# Patient Record
Sex: Male | Born: 1965 | Race: White | Marital: Married | Smoking: Former smoker
Health system: Southern US, Community
[De-identification: ages and names within clinical notes are randomized; demographics above are authoritative.]

## PROBLEM LIST (undated history)

## (undated) DIAGNOSIS — J45909 Unspecified asthma, uncomplicated: Secondary | ICD-10-CM

## (undated) DIAGNOSIS — G4734 Idiopathic sleep related nonobstructive alveolar hypoventilation: Secondary | ICD-10-CM

## (undated) DIAGNOSIS — J449 Chronic obstructive pulmonary disease, unspecified: Secondary | ICD-10-CM

## (undated) HISTORY — DX: Unspecified asthma, uncomplicated: J45.909

## (undated) HISTORY — DX: Idiopathic sleep related nonobstructive alveolar hypoventilation: G47.34

## (undated) HISTORY — DX: Chronic obstructive pulmonary disease, unspecified: J44.9

---

## 2002-12-17 ENCOUNTER — Ambulatory Visit (HOSPITAL_BASED_OUTPATIENT_CLINIC_OR_DEPARTMENT_OTHER): Admission: RE | Admit: 2002-12-17 | Discharge: 2002-12-17 | Payer: Self-pay | Admitting: Plastic Surgery

## 2011-04-03 ENCOUNTER — Ambulatory Visit (INDEPENDENT_AMBULATORY_CARE_PROVIDER_SITE_OTHER): Payer: BC Managed Care – PPO

## 2011-04-03 DIAGNOSIS — J45902 Unspecified asthma with status asthmaticus: Secondary | ICD-10-CM

## 2011-04-03 DIAGNOSIS — F17200 Nicotine dependence, unspecified, uncomplicated: Secondary | ICD-10-CM

## 2012-04-03 ENCOUNTER — Other Ambulatory Visit: Payer: Self-pay | Admitting: Internal Medicine

## 2012-04-13 ENCOUNTER — Ambulatory Visit (INDEPENDENT_AMBULATORY_CARE_PROVIDER_SITE_OTHER): Payer: BC Managed Care – PPO | Admitting: Internal Medicine

## 2012-04-13 VITALS — BP 150/73 | HR 89 | Temp 99.0°F | Resp 16 | Ht 66.0 in | Wt 222.6 lb

## 2012-04-13 DIAGNOSIS — Z6835 Body mass index (BMI) 35.0-35.9, adult: Secondary | ICD-10-CM

## 2012-04-13 DIAGNOSIS — J45909 Unspecified asthma, uncomplicated: Secondary | ICD-10-CM

## 2012-04-13 DIAGNOSIS — F17200 Nicotine dependence, unspecified, uncomplicated: Secondary | ICD-10-CM

## 2012-04-13 MED ORDER — ALBUTEROL SULFATE HFA 108 (90 BASE) MCG/ACT IN AERS: 2.0000 | INHALATION_SPRAY | Freq: Four times a day (QID) | RESPIRATORY_TRACT | Status: DC | PRN

## 2012-04-13 MED ORDER — FLUTICASONE-SALMETEROL 100-50 MCG/DOSE IN AEPB: 1.0000 | INHALATION_SPRAY | Freq: Two times a day (BID) | RESPIRATORY_TRACT | Status: DC

## 2012-04-14 DIAGNOSIS — J45909 Unspecified asthma, uncomplicated: Secondary | ICD-10-CM | POA: Insufficient documentation

## 2012-04-14 NOTE — Progress Notes (Addendum)
  Subjective:    Patient ID: Jeff Cruz., male    DOB: [birth year], 47 y.o.   MRN: [id number]  HPI First followup in over one year Needs refill of asthma medications As not been able to lose weight Continues to need Advair daily to control symptoms/albuterol use is occasional No pulmonary infections this year Continues to smoke but is less than 1 pack per day now Achieved smoking cessation through hypnosis several years ago before restarting, and prefers to try hypnosis again.  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 2006 Couples there 2006-issues resolved Asthma vs rad smoking 2006 Review of Systems Noncontributory    Objective:   Physical Exam BP 150/73  Pulse 89  Temp 99 F (37.2 C) (Oral)  Resp 16  Ht 5\' 6"  (1.676 m)  Wt 222 lb 9.6 oz (100.971 kg)  BMI 35.93 kg/m2  SpO2 93% HEENT clear Lungs clear with no wheezing on forced expiration Heart regular without murmur       Assessment & Plan:  Problem #1 asthma/reactive airway disease Problem #2 nicotine addiction Problem #3 obesity/BMI 35 Problem #4 elevated blood pressure without diagnosis of hypertension  His pulse ox is low at 93/he must quit smoking and lose weight!! Referred to hypnosis Advised to lose weight with goal 2-3 pounds month until ideal body weight around 150/we both acknowledged that this is unlikely aspiration, but goal nonetheless   outside blood pressures to assess for hypertension  Followup for complete physical examination in this next year  Meds ordered this encounter  Medications  . Fluticasone-Salmeterol (ADVAIR) 100-50 MCG/DOSE AEPB    Sig: Inhale 1 puff into the lungs every 12 (twelve) hours.    Dispense:  1 each    Refill:  11  . albuterol (PROVENTIL HFA;VENTOLIN HFA) 108 (90 BASE) MCG/ACT inhaler    Sig: Inhale 2 puffs into the lungs every 6 (six) hours as needed.    Dispense:  1 Inhaler    Refill:   11

## 2013-03-04 ENCOUNTER — Telehealth: Payer: Self-pay

## 2013-03-04 MED ORDER — ALBUTEROL SULFATE HFA 108 (90 BASE) MCG/ACT IN AERS: 2.0000 | INHALATION_SPRAY | Freq: Four times a day (QID) | RESPIRATORY_TRACT | Status: DC | PRN

## 2013-03-04 NOTE — Telephone Encounter (Signed)
RF req from [facility]. RFd x 1

## 2013-03-14 ENCOUNTER — Other Ambulatory Visit: Payer: Self-pay

## 2013-03-14 MED ORDER — FLUTICASONE-SALMETEROL 100-50 MCG/DOSE IN AEPB: 1.0000 | INHALATION_SPRAY | Freq: Two times a day (BID) | RESPIRATORY_TRACT | Status: DC

## 2013-04-10 ENCOUNTER — Other Ambulatory Visit: Payer: Self-pay | Admitting: Internal Medicine

## 2016-09-19 ENCOUNTER — Telehealth: Payer: Self-pay | Admitting: Pulmonary Disease

## 2016-09-19 NOTE — Telephone Encounter (Signed)
lmtcb x1 for Jeff Cruz.  

## 2016-09-19 NOTE — Telephone Encounter (Signed)
I have spoken with Marliss CzarLeigh, who is aware of this matter.  Will await Renee's return call.

## 2016-09-19 NOTE — Telephone Encounter (Signed)
Renee called back and she is aware of appt for pt on wed at 930.

## 2016-09-19 NOTE — Telephone Encounter (Signed)
I have called and lmomtcb x 2 for renee.

## 2016-09-21 ENCOUNTER — Ambulatory Visit (INDEPENDENT_AMBULATORY_CARE_PROVIDER_SITE_OTHER)
Admission: RE | Admit: 2016-09-21 | Discharge: 2016-09-21 | Disposition: A | Discharge status: Home / Self Care | Payer: 59 | Source: Ambulatory Visit | Attending: Pulmonary Disease | Admitting: Pulmonary Disease

## 2016-09-21 ENCOUNTER — Ambulatory Visit (INDEPENDENT_AMBULATORY_CARE_PROVIDER_SITE_OTHER): Payer: 59 | Admitting: Pulmonary Disease

## 2016-09-21 ENCOUNTER — Other Ambulatory Visit (INDEPENDENT_AMBULATORY_CARE_PROVIDER_SITE_OTHER): Payer: 59

## 2016-09-21 ENCOUNTER — Encounter: Payer: Self-pay | Admitting: Pulmonary Disease

## 2016-09-21 VITALS — BP 128/80 | HR 72 | Temp 98.3°F | Ht 66.0 in | Wt 175.5 lb

## 2016-09-21 DIAGNOSIS — J449 Chronic obstructive pulmonary disease, unspecified: Secondary | ICD-10-CM

## 2016-09-21 DIAGNOSIS — F17210 Nicotine dependence, cigarettes, uncomplicated: Secondary | ICD-10-CM

## 2016-09-21 DIAGNOSIS — F419 Anxiety disorder, unspecified: Secondary | ICD-10-CM | POA: Diagnosis not present

## 2016-09-21 DIAGNOSIS — B370 Candidal stomatitis: Secondary | ICD-10-CM

## 2016-09-21 LAB — CBC WITH DIFFERENTIAL/PLATELET
BASOS ABS: 0 10*3/uL (ref 0.0–0.1)
Basophils Relative: 0.5 % (ref 0.0–3.0)
EOS ABS: 0.2 10*3/uL (ref 0.0–0.7)
Eosinophils Relative: 2 % (ref 0.0–5.0)
HEMATOCRIT: 46.2 % (ref 39.0–52.0)
Hemoglobin: 15.5 g/dL (ref 13.0–17.0)
LYMPHS PCT: 23.5 % (ref 12.0–46.0)
Lymphs Abs: 2 10*3/uL (ref 0.7–4.0)
MCHC: 33.5 g/dL (ref 30.0–36.0)
MCV: 95.1 fl (ref 78.0–100.0)
MONOS PCT: 7.1 % (ref 3.0–12.0)
Monocytes Absolute: 0.6 10*3/uL (ref 0.1–1.0)
NEUTROS ABS: 5.7 10*3/uL (ref 1.4–7.7)
Neutrophils Relative %: 66.9 % (ref 43.0–77.0)
PLATELETS: 236 10*3/uL (ref 150.0–400.0)
RBC: 4.86 Mil/uL (ref 4.22–5.81)
RDW: 14.2 % (ref 11.5–15.5)
WBC: 8.5 10*3/uL (ref 4.0–10.5)

## 2016-09-21 LAB — COMPREHENSIVE METABOLIC PANEL
ALK PHOS: 50 U/L (ref 39–117)
ALT: 11 U/L (ref 0–53)
AST: 18 U/L (ref 0–37)
Albumin: 4.5 g/dL (ref 3.5–5.2)
BILIRUBIN TOTAL: 0.7 mg/dL (ref 0.2–1.2)
BUN: 10 mg/dL (ref 6–23)
CALCIUM: 9.4 mg/dL (ref 8.4–10.5)
CO2: 33 meq/L — AB (ref 19–32)
CREATININE: 0.76 mg/dL (ref 0.40–1.50)
Chloride: 99 mEq/L (ref 96–112)
GFR: 114.49 mL/min (ref >60.00)
GLUCOSE: 113 mg/dL — AB (ref 70–99)
Potassium: 4.5 mEq/L (ref 3.5–5.1)
Sodium: 139 mEq/L (ref 135–145)
TOTAL PROTEIN: 7.5 g/dL (ref 6.0–8.3)

## 2016-09-21 LAB — TSH: TSH: 1.42 u[IU]/mL (ref 0.35–4.50)

## 2016-09-21 MED ORDER — FLUCONAZOLE 100 MG PO TABS: 100.0000 mg | ORAL_TABLET | Freq: Every day | ORAL | 0 refills | Status: DC

## 2016-09-21 MED ORDER — UMECLIDINIUM-VILANTEROL 62.5-25 MCG/INH IN AEPB: 1.0000 | INHALATION_SPRAY | Freq: Every day | RESPIRATORY_TRACT | 11 refills | Status: DC

## 2016-09-21 MED ORDER — PREDNISONE 20 MG PO TABS: ORAL_TABLET | ORAL | 0 refills | Status: DC

## 2016-09-21 MED ORDER — ALBUTEROL SULFATE 108 (90 BASE) MCG/ACT IN AEPB: 1.0000 | INHALATION_SPRAY | Freq: Four times a day (QID) | RESPIRATORY_TRACT | 11 refills | Status: DC | PRN

## 2016-09-21 NOTE — Progress Notes (Signed)
Subjective:     Patient ID: Jeff Cruz., male   DOB: [birth year], 51 y.o.   MRN: [id number]  HPI  ~  September 21, 2016:  Initial pulmonary consult by SN>  Ivory Broad [name]) is referred by his family for a pulmonary eval- he is 51 y/o & a current smoker of 1ppd w/ a 35+pack-yr hx;  He knows that he needs to quit & previously quit for awhile after hypnosis by ?DrOxley, Chantix was w/o benefit he says;  His CC is SOB/ DOE which he feels is slowly getting worse & noticed w/ recreation like water skiing, & also w/ yard work Social research officer, government;  He has a mild AM cough, small amt yellow sput, no hemoptysis, no CP, no hx cardiac issues, he's been treated by PCP & UC for bronchitis in past; he doesn't recall prev CXR; PCP is DrJGolding & current meds include Symbicort160-2spBid, AlbutHFA prn...  Smoking Hx>  Started age ~22, up to 1ppd max, for a 35 pack-yr smoking hx at age 60...   Pulmonary Hx>  Notes occas bronchitic infections, no hx pneumonia, no hx asthma, etc;  No hosp for resp problems,   Medical Hx>  Epic record indicates:  Borderline HBP, "asthma", obesity, referred to [facility] for colonoscopy per DrGolding...  Family Hx>  + for DM, heart dis, & chr lung dis in mother...  Occup Hx>    Current Meds>  Symbicort160-2spBid, albutHFA prn... EXAM shows Afeb, VSS, O2sat=95% on RA at rest;  Wt=176# (he's lost 50# over the last few yrs on diet);  HEENT +thrush, ear wax, no adenopathy;  Chest- bilat congestion/ wheezing/ rhonchi;  Heart- RR w/o m/r/g detected;  Abd- soft nontender neg;  Ext- sl VI w/o c/c/e;  Neuro- intact;  Derm- "farmer's tan"...   CXR 09/21/16 (independently reviewed by me in the PACS system) showed norm heart size, hyperexpanded lungs w/ vasc attenuation c/w COPD/ emphysema- NAD...   Spirometry 09/21/16>  FVC=1.87 (43%), FEV1=0.83 (24%), %1sec=44%, mid-flows reduced at 11% predicted;  This is c/w severe airflow obstruction and GOLD Stage 4 COPD...  Ambulatory oximetry 09/21/16 on RA>   O2sat=94% on RA at rest w/ heart rate=70/min;  He ambulated 3 Laps in office (185'ea) w/ lowest O2sat=86% w/ pulse 101/min...  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 Asthma     History reviewed. No pertinent surgical history.   Outpatient Encounter  Prescriptions as of 09/21/2016  Medication Sig  .  albuterol (PROVENTIL HFA;VENTOLIN HFA) 108 (90 BASE) MCG/ACT inhaler Inhale 2 puffs into the lungs every 6 (six) hours as needed. PATIENT NEEDS OFFICE VISIT FOR ADDITIONAL REFILLS  .  budesonide-formoterol (SYMBICORT) 160-4.5 MCG/ACT inhaler Inhale 2 puffs into the lungs 2 (two) times daily.  . [DISCONTINUED] Fluticasone-Salmeterol (ADVAIR) 100-50 MCG/DOSE AEPB Inhale 1 puff into the lungs every 12 (twelve) hours. PATIENT NEEDS OFFICE VISIT FOR ADDITIONAL REFILLS (Patient not taking: Reported on 09/21/2016)    No Known Allergies   Family History  Problem Relation Age of Onset  . Diabetes Mother   . Heart disease Father   . Heart disease Maternal Grandmother   . Heart  disease Maternal Grandfather   . Heart disease Paternal Grandmother   . Heart disease Paternal Grandfather     Social History   Social History  . Marital status: Divorced    Spouse name: N/A  . Number of children: N/A  . Years of education: N/A   Occupational History  . Not on file.   Social History Main Topics  . Smoking status: Current Every Day Smoker    Packs/day: 1.00    Years: 35.00    Types: Cigarettes  . Smokeless tobacco: Never Used  . Alcohol use Not on file  . Drug use: Unknown  . Sexual activity: Not on file   Other Topics Concern  . Not on file   Social History Narrative  . No narrative on file    Immunization History  Administered Date(s) Administered  . Influenza,inj,Quad PF,36+ Mos 12/28/2015    Current Medications, Allergies, Past Medical History, Past Surgical History, Family History, and Social History were reviewed in [employer] record.   Review of Systems          All symptoms NEG except where BOLDED >>  Constitutional:  F/C/S, fatigue, anorexia, unexpected weight change. HEENT:  HA, visual changes, hearing loss, earache, nasal symptoms, sore throat, mouth sores, hoarseness. Resp:  cough, sputum, hemoptysis; SOB, tightness, wheezing. Cardio:  CP, palpit, DOE, orthopnea, edema. GI:  N/V/D/C, blood in stool; reflux, abd pain, distention, gas. GU:  dysuria, freq, urgency, hematuria, flank pain, voiding difficulty. MS:  joint pain, swelling, tenderness, decr ROM; neck pain, back pain, etc. Neuro:  HA, tremors, seizures, dizziness, syncope, weakness, numbness, gait abn. 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 Psyche:  confusion, agitation, sleep disturbance, hallucinations, anxiety, depression suicidal.   Objective:   Physical Exam    Vital Signs:  Reviewed...   General:  WD, WN, 51 y/o WM in NAD; alert & oriented; pleasant &  cooperative... HEENT:  [place]/AT; Conjunctiva- pink, Sclera- nonicteric, EOM-wnl, PERRLA; EACs-wax; NOSE-clear; THROAT- +thrush Neck:  Supple w/ fair ROM; no JVD; normal carotid impulses w/o bruits; no thyromegaly or nodules palpated; no lymphadenopathy.  Chest:  Slight decr BS at bases, bilat congested lungs w/ rhonchi & exp wheezing, no consolidation... Heart:  Regular Rhythm; norm S1 & S2 without murmurs, rubs, or gallops detected. Abdomen:  Soft & nontender- no guarding or rebound; normal bowel sounds; no organomegaly or masses palpated. Ext:  Normal ROM; without deformities or arthritic changes; no varicose veins, venous insuffic, or edema;  Pulses intact w/o bruits. Neuro:  CNs II-XII intact; motor testing normal; sensory testing normal; gait normal & balance OK. Derm:  He is way too tanned- no lesions noted; no rash etc.  Lymph:  No cervical, supraclavicular, axillary, or inguinal adenopathy palpated.   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;  He must quit all smoking- Chantix w/o help in past but hypnosis helped therefore "go for it" as the end justifies the means;  We discussed changing the Symbicort to [facility] one inhalation daily & continue the albutHFA rescue inhaler as needed (we reviewed rinsing technique after the rx);  Add MUCINEX 653m 2Bid w/ fluids;  For his thrush we wrote for DIFLUCAN 1065mx7d and rec an OTC Probiotic as well;  Finally we decided  To Rx w/ a PREDNISONE taper- see AVS;  He will ret for f/u 21m70mo      Plan:     Patient's Medications  New Prescriptions   ALBUTEROL SULFATE (PROAIR RESPICLICK) 1082700 BASE) MCG/ACT AEPB    Inhale 1-2 puffs into the lungs every 6 (six)  hours as needed.   FLUCONAZOLE (DIFLUCAN) 100 MG TABLET    Take 1 tablet (100 mg total) by mouth daily.   PREDNISONE (DELTASONE) 20 MG TABLET    Take as directed per Dr. NadLenna GilfordUMECLIDINIUM-VILANTEROL (AN[facility] Center[facility]IPTA) 62.5-25 MCG/INH AEPB    Inhale 1 puff into the lungs daily.  Previous Medications   No medications on file  Modified Medications   No medications on file  Discontinued Medications   ALBUTEROL (PROVENTIL HFA;VENTOLIN HFA) 108 (90 BASE) MCG/ACT INHALER    Inhale 2 puffs into the lungs every 6 (six) hours as needed. PATIENT NEEDS OFFICE VISIT FOR ADDITIONAL REFILLS   BUDESONIDE-FORMOTEROL (SYMBICORT) 160-4.5 MCG/ACT INHALER    Inhale 2 puffs into the lungs 2 (two) times daily.   FLUTICASONE-SALMETEROL (ADVAIR) 100-50 MCG/DOSE AEPB    Inhale 1 puff into the lungs every 12 (twelve) hours. PATIENT NEEDS OFFICE VISIT FOR ADDITIONAL REFILLS

## 2016-09-21 NOTE — Patient Instructions (Signed)
Today we updated your med list in our EPIC system...     Today we checked a CXR, Spirometry breathing test, an ambulatory oximetry test, and some lab work...    We will contact you w/ the results when available...   Your MOST IMPORTANT intervention is to QUIT SMOKING!!!   Hypnosis has worked for you-- do it again & stay quit (inquire if addition sessions are needed to re-inforce)...    Try the nicotine patches or gum avail OTC if needed to help...  [facility] the [facility] =>    Take the DIFLUCAN 100mg  one tab daily x 7d til gone...    It would be helpful to restore the normal gut flora w/ a probiotic like ALIGN- take one daily + Activia yogurt...  For the COPD/ chronic bronchitic symptoms =>     We are going to change the Advair to [facility] one inhalation daily...    Continue your ALBUTEROL (ProairHFA) rescue inhaler at 1-2 sprays every 6H as needed...    Add-in the OTC MUCINEX 600mg  tabs taking 1-2 tabs twice daily w/ extra fluids (water)...  I am recommending a tapering course of oral PREDNISONE for the signif airway inflammatuion that is present now:    Start w/ one tab twice daily for 5 days...    Then decrease to 1 tab each AM for 5 days...    Then decrease to 1/2 tab daily in the AM for 5days...    Then decrease to 1/2 tab every other day til gone (1/2, 0, 1/2, 0, etc)...  Call for any questions...  Let's plan a follow up visit in 35month for recheck

## 2016-09-22 LAB — RESPIRATORY ALLERGY PROFILE REGION II ~~LOC~~
Allergen, A. alternata, m6: <0.1 kU/L
Allergen, Cedar tree, t12: <0.1 kU/L
Allergen, Comm Silver Birch, t9: <0.1 kU/L
Allergen, Mouse Urine Protein, e78: <0.1 kU/L
Allergen, P. notatum, m1: <0.1 kU/L
Aspergillus fumigatus, m3: <0.1 kU/L
Common Ragweed: <0.1 kU/L
Dog Dander: <0.1 kU/L
IgE (Immunoglobulin E), Serum: 35 kU/L (ref <115)
Rough Pigweed  IgE: <0.1 kU/L
Sheep Sorrel IgE: <0.1 kU/L

## 2016-10-25 ENCOUNTER — Encounter: Payer: Self-pay | Admitting: Pulmonary Disease

## 2016-10-25 ENCOUNTER — Ambulatory Visit (INDEPENDENT_AMBULATORY_CARE_PROVIDER_SITE_OTHER): Payer: 59 | Admitting: Pulmonary Disease

## 2016-10-25 VITALS — BP 136/78 | HR 66 | Temp 98.2°F | Ht 66.0 in | Wt 181.1 lb

## 2016-10-25 DIAGNOSIS — J449 Chronic obstructive pulmonary disease, unspecified: Secondary | ICD-10-CM | POA: Diagnosis not present

## 2016-10-25 DIAGNOSIS — F17210 Nicotine dependence, cigarettes, uncomplicated: Secondary | ICD-10-CM

## 2016-10-25 MED ORDER — PREDNISONE 10 MG PO TABS: ORAL_TABLET | ORAL | 2 refills | Status: DC

## 2016-10-25 MED ORDER — VARENICLINE TARTRATE 1 MG PO TABS: 1.0000 mg | ORAL_TABLET | Freq: Two times a day (BID) | ORAL | 1 refills | Status: DC

## 2016-10-25 MED ORDER — VARENICLINE TARTRATE 0.5 MG X 11 & 1 MG X 42 PO MISC: ORAL | 0 refills | Status: DC

## 2016-10-25 NOTE — Progress Notes (Addendum)
Subjective:     Patient ID: Jeff Cruz., male   DOB: 09/23/1965, 51 y.o.   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  ~  September 21, 2016:  Initial pulmonary consult by SN>  [name]) is referred by his family for a pulmonary eval- he is 51 y/o & a current smoker of 1ppd w/ a 35+pack-yr hx;  He knows that he needs to quit & previously quit for awhile after hypnosis by ?DrOxley, Chantix was w/o benefit he says;  His CC is SOB/ DOE which he feels is slowly getting worse & noticed w/ recreation like water skiing, & also w/ yard work Social research officer, government;  He has a mild AM cough, small amt yellow sput, no hemoptysis, no CP, no hx cardiac issues, he's been treated by PCP & UC for bronchitis in past; he doesn't recall prev CXR; PCP is DrJGolding & current meds include Symbicort160-2spBid, AlbutHFA prn...  Smoking Hx>  Started age ~89, up to 1ppd max, for a 35 pack-yr smoking hx at age 13...   Pulmonary Hx>  Notes occas bronchitic infections, no hx pneumonia, no hx asthma, etc;  No hosp for resp problems,   Medical Hx>  Epic record indicates:  Borderline HBP, "asthma", obesity, referred to [facility] for colonoscopy per DrGolding...  Family Hx>  + for DM, heart dis, & chr lung dis in mother...  Occup Hx>  He is a Fish farm manager  Current Meds>  Symbicort160-2spBid, AlbutHFA prn... EXAM shows Afeb, VSS, O2sat=95% on RA at rest;  Wt=176# (he's lost 50# over the last few yrs on diet);  HEENT +thrush, ear wax, no adenopathy;  Chest- bilat congestion/ wheezing/ rhonchi;  Heart- RR w/o m/r/g detected;  Abd- soft nontender neg;  Ext- sl VI w/o c/c/e;  Neuro- intact;  Derm- "farmer's tan"...   CXR 09/21/16 (independently reviewed by me in the PACS system) showed norm heart size,  hyperexpanded lungs w/ vasc attenuation c/w COPD/ emphysema- NAD...   Spirometry 09/21/16>  FVC=1.87 (43%), FEV1=0.83 (24%), %1sec=44%, mid-flows reduced at 11% predicted;  This is c/w severe airflow obstruction and GOLD Stage 4 COPD...  Ambulatory oximetry 09/21/16 on RA>  O2sat=94% on RA at rest w/ heart rate=70/min;  He ambulated 3 Laps in office (185'ea) w/ lowest O2sat=86% w/ pulse 101/min...  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;  He must quit all smoking- Chantix w/o help in past but hypnosis helped therefore "go for it" as the end justifies the means;  We discussed changing the Symbicort to [facility] one  inhalation daily & continue the AlbutHFA rescue inhaler as needed (we reviewed rinsing technique after the rx);  Add MUCINEX 654m 2Bid w/ fluids;  For his thrush we wrote for DIFLUCAN 1046mx7d and rec an OTC Probiotic as well;  Finally we decided  To Rx w/ a PREDNISONE taper- see AVS;  He will ret for f/u 34m47mo    ~  October 25, 2016:  34mo53mo & David reports a great response to the Pred taper, plus Anoro & Mucinex;  He notes however that when the Pred ran out 1 week ago that his breathing deteriorated w/ some incr in cough, congestion, and SOB/DOE which required him to start using the rescue inhaler every 3-4H;  Unfortunately he is still smoking 1ppd  & has been unable to quit on his own- he checked w/ ?DrOxley but the cost is $400 & he is requesting a Rx for Chantix + rec for nicotine replacement therapy w/ patches, gum, or lozenges... We discussed the need for further evaluation w/ FullPFTs and a CT Chest...    EXAM shows Afeb, VSS, O2sat=95% on RA at rest;  Wt=181# (up 6#);  HEENT thrush resolved, +ear wax, no adenopathy;  Chest- bilat congestion/ sl wheezing/ +rhonchi;  Heart- RR w/o m/r/g detected;  Abd- soft nontender neg;  Ext- sl VI w/o c/c/e;  Neuro- intact;  Derm- "farmer's tan"...   Hi-res CT CHEST 11/03/16 showed norm heart size, dilated main pulm art at 3.5cm; no adenopathy, mild centrilob & paraseptal emphysema w/ diffuse bronchial wall thickening, scat tiny RUL granuloma and a 1cm subsolidRLL pulm nodule, granulomatous splenic calcif, mod thoracic spondylosis => we will proceed w/ 2DEcho to check for pulm HTN...  2DEcho - pending IMP/PLAN>>  We decided to hold on the FullPFTs for now & will order the Hi-res CT Chest for further evaluation (CT shows no signs of ILD, mixed chr bronchitic & emphysema pathology, incidental 1cm RLL nodule; Spirometry showed that his COPD is severe w/ FEV1=0.83 (24%);  We need to place him back on the PREDNISONE (lower dose, ie-20 then10) and wean it more slowly as his response was very good by his description & he did not use the rescue inhaler while on the Pred, but after he weaned off he found that he needed the AlbutHFA every 3-4H;  He knows that he must quit smoking- the Chantix + Nicoderm will likely cost him as much but we wrote the prescriptions as requested;  His 2DEcho is pending- we plan rov recheck in 106mo,55moner if needed prn... NOTE:  >50% of this 25min65m was spent in counseling & coordination of care...  ADDENDUM>>  2DEcho 11/08/16 showed norm LV w/ vigorous LVF & EF=65-70% w/ norm wall motion 7 no regional wall motion abn, Gr1DD, AoV poorly vis w/ at least mild AS (mean grad=12mmHg42mV w/ triv  MR, RV size & function normal, PAsys=45mmHg.56mwe plan rov recheck, assess need for Home O2 at this point, monitor AS & PA press going forward...    Past Medical History:  Diagnosis Date  . Asthma     No past surgical history on file.   Outpatient Encounter Prescriptions as of 10/25/2016  Medication Sig  . Albuterol Sulfate (PROAIR RESPICLICK) 108 (90 469e) MCG/ACT AEPB Inhale 1-2 puffs into the lungs every 6 (six) hours as needed.  . umeclidinium-vilanterol (ANORO ELLIPTA) 62.5-25 MCG/INH AEPB Inhale 1 puff into the lungs daily.  . fluconazole (DIFLUCAN) 100 MG tablet Take 1 tablet (100 mg total) by  mouth daily. (Patient not taking: Reported on 10/25/2016)  . predniSONE (DELTASONE) 10 MG tablet Take 2 tablets every morning x 2 weeks, then take 1 tablet every morning thereafter.  . varenicline (CHANTIX CONTINUING MONTH PAK) 1 MG tablet Take 1 tablet (1 mg total) by mouth 2 (two) times daily.  . varenicline (CHANTIX STARTING MONTH PAK) 0.5 MG X 11 & 1 MG X 42 tablet Take one 0.5 mg tablet by mouth once daily for 3 days, then increase to one 0.5 mg tablet twice daily for 4 days, then increase to one 1 mg tablet twice daily.  . [DISCONTINUED] predniSONE (DELTASONE) 20 MG tablet Take as directed per Dr. Lenna Gilford (Patient not taking: Reported on 10/25/2016)   No facility-administered encounter medications on file as of 10/25/2016.     No Known Allergies   Family History  Problem Relation Age of Onset  . Diabetes Mother   . Heart disease Father   . Heart disease Maternal Grandmother   . Heart disease Maternal Grandfather   . Heart disease Paternal Grandmother   . Heart disease Paternal Grandfather     Social History   Social History  . Marital status: Divorced    Spouse name: N/A  . Number of children: N/A  . Years of education: N/A   Occupational History  . Not on file.   Social History Main Topics  . Smoking status: Current Every Day Smoker    Packs/day: 1.00    Years: 35.00     Types: Cigarettes  . Smokeless tobacco: Never Used  . Alcohol use Not on file  . Drug use: Unknown  . Sexual activity: Not on file   Other Topics Concern  . Not on file   Social History Narrative  . No narrative on file    Immunization History  Administered Date(s) Administered  . Influenza,inj,Quad PF,36+ Mos 12/28/2015    Current Medications, Allergies, Past Medical History, Past Surgical History, Family History, and Social History were reviewed in [employer] record.   Review of Systems          All symptoms NEG except where BOLDED >>  Constitutional:  F/C/S, fatigue, anorexia, unexpected weight change. HEENT:  HA, visual changes, hearing loss, earache, nasal symptoms, sore throat, mouth sores, hoarseness. Resp:  cough, sputum, hemoptysis; SOB, tightness, wheezing. Cardio:  CP, palpit, DOE, orthopnea, edema. GI:  N/V/D/C, blood in stool; reflux, abd pain, distention, gas. GU:  dysuria, freq, urgency, hematuria, flank pain, voiding difficulty. MS:  joint pain, swelling, tenderness, decr ROM; neck pain, back pain, etc. Neuro:  HA, tremors, seizures, dizziness, syncope, weakness, numbness, gait abn. 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 Psyche:  confusion, agitation, sleep disturbance, hallucinations, anxiety, depression suicidal.   Objective:   Physical Exam    Vital Signs:  Reviewed...   General:  WD, WN, 52 y/o WM in NAD; alert & oriented; pleasant & cooperative... HEENT:  [place]/AT; Conjunctiva- pink, Sclera- nonicteric, EOM-wnl, PERRLA; EACs-wax; NOSE-clear; THROAT- +thrush Neck:  Supple w/ fair ROM; no JVD; normal carotid impulses w/o bruits; no thyromegaly or nodules palpated; no lymphadenopathy.  Chest:  Slight decr BS at bases, bilat congested lungs w/ rhonchi & exp wheezing, no consolidation... Heart:  Regular Rhythm; norm S1 & S2 without murmurs, rubs,  or gallops detected. Abdomen:  Soft & nontender- no guarding or rebound; normal bowel sounds; no organomegaly or masses palpated. Ext:  Normal  ROM; without deformities or arthritic changes; no varicose veins, venous insuffic, or edema;  Pulses intact w/o bruits. Neuro:  CNs II-XII intact; motor testing normal; sensory testing normal; gait normal & balance OK. Derm:  He is way too tanned- no lesions noted; no rash etc. Lymph:  No cervical, supraclavicular, axillary, or inguinal adenopathy palpated.   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;  He must quit all smoking- Chantix w/o help in past but hypnosis helped therefore "go for it" as the end justifies the means;  We discussed [facility] the [facility] & continue the albutHFA rescue inhaler as needed (we reviewed rinsing technique after the rx);  Add MUCINEX 6045m2Bid w/ fluids;  For his thrush we wrote for DIFLUCAN 1009m7d and rec an OTC Probiotic as well;  Finally we decided  To Rx w/ a PREDNISONE taper- see AVS;  He will ret for f/u 46mo6mo 10/25/16>   We decided to hold on the FullPFTs for now & will order the Hi-res CT Chest for further evaluation;  We need to place him back on the PREDNISONE (lower dose, ie-20 then10) and wean it more slowly as his response was very good by his description & he did not use the rescue inhaler while on the Pred, but after he  weaned off he found that he needed the AlbutHFA every 3-4H;  He knows that he must quit smoking- the Chantix + Nicoderm will likely cost him as much but we wrote the prescriptions as requested;  We plan rov recheck in 72mo,50moner if needed prn     Plan:     Patient's Medications  New Prescriptions   PREDNISONE (DELTASONE) 10 MG TABLET    Take 2 tablets every morning x 2 weeks, then take 1 tablet every morning thereafter.   VARENICLINE (CHANTIX CONTINUING MONTH PAK) 1 MG TABLET    Take 1 tablet (1 mg total) by mouth 2 (two) times daily.   VARENICLINE (CHANTIX STARTING MONTH PAK) 0.5 MG X 11 & 1 MG X 42 TABLET    Take one 0.5 mg tablet by mouth once daily for 3 days, then increase to one 0.5 mg tablet twice daily for 4 days, then increase to one 1 mg tablet twice daily.  Previous Medications   ALBUTEROL SULFATE (PROAIR RESPICLICK) 108 (568BASE) MCG/ACT AEPB    Inhale 1-2 puffs into the lungs every 6 (six) hours as needed.   FLUCONAZOLE (DIFLUCAN) 100 MG TABLET    Take 1 tablet (100 mg total) by mouth daily.   UMECLIDINIUM-VILANTEROL (ANORO ELLIPTA) 62.5-25 MCG/INH AEPB    Inhale 1 puff into the lungs daily.  Modified Medications   No medications on file  Discontinued Medications   PREDNISONE (DELTASONE) 20 MG TABLET    Take as directed per  Dr. Lenna Gilford

## 2016-10-25 NOTE — Patient Instructions (Signed)
Today we updated your med list in our EPIC system...    Continue your current medications the same...  We decided to [facility] the [facility]...  We decided to re-start the PREDNISONE-- use the 10mg  tabs as follows:    Start w/ two tabs (20mg ) each AM for about 2 weeks...    Then decrease to one tab (10mg ) each AM until your return visit in 60mo  We wrote [facility] the [facility] as requested...  Also try the OTC NICOTINE replacement Rx-- GUM, PATCHES, or LOZENGES...    Start w/ the 21mg  patch & wean the dose down slowly to 14mg  then 7mg  then off  Finally we are going to arrange for a Hi-resolution CT Chest for further evaluation of your lung disease...    We will contact you w/ the results when available...   Call for any questions...  Let's plan a follow up visit in 60mo, sooner if needed for problems.Marland Kitchen..Marland Kitchen

## 2016-11-01 ENCOUNTER — Ambulatory Visit (HOSPITAL_COMMUNITY)
Admission: RE | Admit: 2016-11-01 | Discharge: 2016-11-01 | Disposition: A | Discharge status: Home / Self Care | Payer: 59 | Source: Ambulatory Visit | Attending: Pulmonary Disease | Admitting: Pulmonary Disease

## 2016-11-01 DIAGNOSIS — I281 Aneurysm of pulmonary artery: Secondary | ICD-10-CM | POA: Insufficient documentation

## 2016-11-01 DIAGNOSIS — J432 Centrilobular emphysema: Secondary | ICD-10-CM | POA: Insufficient documentation

## 2016-11-01 DIAGNOSIS — J438 Other emphysema: Secondary | ICD-10-CM | POA: Diagnosis not present

## 2016-11-01 DIAGNOSIS — R911 Solitary pulmonary nodule: Secondary | ICD-10-CM | POA: Insufficient documentation

## 2016-11-01 DIAGNOSIS — F17210 Nicotine dependence, cigarettes, uncomplicated: Secondary | ICD-10-CM | POA: Insufficient documentation

## 2016-11-04 ENCOUNTER — Other Ambulatory Visit: Payer: Self-pay | Admitting: Pulmonary Disease

## 2016-11-04 DIAGNOSIS — J449 Chronic obstructive pulmonary disease, unspecified: Secondary | ICD-10-CM

## 2016-11-08 ENCOUNTER — Ambulatory Visit (HOSPITAL_COMMUNITY): Payer: 59 | Attending: Cardiology

## 2016-11-08 ENCOUNTER — Other Ambulatory Visit: Payer: Self-pay

## 2016-11-08 DIAGNOSIS — I060 Rheumatic aortic stenosis: Secondary | ICD-10-CM | POA: Diagnosis not present

## 2016-11-08 DIAGNOSIS — I272 Pulmonary hypertension, unspecified: Secondary | ICD-10-CM | POA: Insufficient documentation

## 2016-11-08 DIAGNOSIS — I5030 Unspecified diastolic (congestive) heart failure: Secondary | ICD-10-CM | POA: Diagnosis not present

## 2016-11-08 DIAGNOSIS — J449 Chronic obstructive pulmonary disease, unspecified: Secondary | ICD-10-CM | POA: Diagnosis not present

## 2016-12-26 ENCOUNTER — Ambulatory Visit (INDEPENDENT_AMBULATORY_CARE_PROVIDER_SITE_OTHER): Payer: 59 | Admitting: Pulmonary Disease

## 2016-12-26 ENCOUNTER — Encounter: Payer: Self-pay | Admitting: Pulmonary Disease

## 2016-12-26 VITALS — BP 120/68 | HR 77 | Ht 67.0 in | Wt 187.0 lb

## 2016-12-26 DIAGNOSIS — F17210 Nicotine dependence, cigarettes, uncomplicated: Secondary | ICD-10-CM

## 2016-12-26 DIAGNOSIS — J449 Chronic obstructive pulmonary disease, unspecified: Secondary | ICD-10-CM

## 2016-12-26 MED ORDER — METHOCARBAMOL 500 MG PO TABS: 500.0000 mg | ORAL_TABLET | Freq: Four times a day (QID) | ORAL | 0 refills | Status: DC

## 2016-12-26 NOTE — Patient Instructions (Addendum)
Today we updated your med list in our EPIC system...    Continue your current medications the same...  Continue the Prednisone  daily in the AM... Continue the [facility] one inhalation daily... Continue the ALBUTEROL rescue inhaler 1puff every 4-6h as needed for wheezing... Continue the Mucinex  tabs 1-2 tabs twice daily w/ fluids...  Hopefully the hypnosis will once again be successful  In getting you to quit smoking...  Today we did an ambulatory oxygen test... We will arrange for an OVERNIGHT oxygen test as well...    We will contact you w/ the results when available...   For your back discomfort>    Careful lifting etc...    Apply heat at home & deep heat patch if out 7 about...    We wrote a new prescription for ROBAXIN  one tab up to 3 times daily as needed for muscle spasm...  Call for any questions...  Let's plan a follow up visit in 2-47mo, sooner if needed for problems.Marland KitchenMarland Kitchen

## 2016-12-26 NOTE — Progress Notes (Addendum)
Subjective:     Patient ID: Jeff Cruz., male   DOB: 09/23/1965, 51 y.o.   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  ~  September 21, 2016:  Initial pulmonary consult by SN>  [name]) is referred by his family for a pulmonary eval- he is 51 y/o & a current smoker of 1ppd w/ a 35+pack-yr hx;  He knows that he needs to quit & previously quit for awhile after hypnosis by ?DrOxley, Chantix was w/o benefit he says;  His CC is SOB/ DOE which he feels is slowly getting worse & noticed w/ recreation like water skiing, & also w/ yard work Social research officer, government;  He has a mild AM cough, small amt yellow sput, no hemoptysis, no CP, no hx cardiac issues, he's been treated by PCP & UC for bronchitis in past; he doesn't recall prev CXR; PCP is DrJGolding & current meds include Symbicort160-2spBid, AlbutHFA prn...  Smoking Hx>  Started age ~89, up to 1ppd max, for a 35 pack-yr smoking hx at age 13...   Pulmonary Hx>  Notes occas bronchitic infections, no hx pneumonia, no hx asthma, etc;  No hosp for resp problems,   Medical Hx>  Epic record indicates:  Borderline HBP, "asthma", obesity, referred to [facility] for colonoscopy per DrGolding...  Family Hx>  + for DM, heart dis, & chr lung dis in mother...  Occup Hx>  He is a Fish farm manager  Current Meds>  Symbicort160-2spBid, AlbutHFA prn... EXAM shows Afeb, VSS, O2sat=95% on RA at rest;  Wt=176# (he's lost 50# over the last few yrs on diet);  HEENT +thrush, ear wax, no adenopathy;  Chest- bilat congestion/ wheezing/ rhonchi;  Heart- RR w/o m/r/g detected;  Abd- soft nontender neg;  Ext- sl VI w/o c/c/e;  Neuro- intact;  Derm- "farmer's tan"...   CXR 09/21/16 (independently reviewed by me in the PACS system) showed norm heart size,  hyperexpanded lungs w/ vasc attenuation c/w COPD/ emphysema- NAD...   Spirometry 09/21/16>  FVC=1.87 (43%), FEV1=0.83 (24%), %1sec=44%, mid-flows reduced at 11% predicted;  This is c/w severe airflow obstruction and GOLD Stage 4 COPD...  Ambulatory oximetry 09/21/16 on RA>  O2sat=94% on RA at rest w/ heart rate=70/min;  He ambulated 3 Laps in office (185'ea) w/ lowest O2sat=86% w/ pulse 101/min...  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   ~  October 25, 2016:  130moOV & Jeff reports a great response to the Pred taper, plus Anoro & Mucinex;  He notes however that when the Pred ran out 1 week ago that his breathing deteriorated w/ some incr in cough, congestion, and SOB/DOE which required him to start using the rescue inhaler every 3-4H;  Unfortunately he is still smoking 1ppd &  has been unable to quit on his own- he checked w/ ?DrOxley but the cost is $400 & he is requesting a Rx for Chantix + rec for nicotine replacement therapy w/ patches, gum, or lozenges... We discussed the need for further evaluation w/ FullPFTs and a CT Chest...    EXAM shows Afeb, VSS, O2sat=95% on RA at rest;  Wt=181# (up 6#);  HEENT thrush resolved, +ear wax, no adenopathy;  Chest- bilat congestion/ sl wheezing/ +rhonchi;  Heart- RR w/o m/r/g detected;  Abd- soft nontender neg;  Ext- sl VI w/o c/c/e;  Neuro- intact;  Derm- "farmer's tan"...   Hi-res CT CHEST 11/03/16 showed norm heart size, dilated main pulm art at 3.5cm; no adenopathy, mild centrilob & paraseptal emphysema w/ diffuse bronchial wall thickening, scat tiny RUL granuloma and a 1cm subsolidRLL pulm nodule, granulomatous splenic calcif, mod thoracic spondylosis => we will proceed w/ 2DEcho to check for pulm HTN...  2DEcho - pending IMP/PLAN>>  We decided to hold on the FullPFTs for now & will order the Hi-res CT Chest for further evaluation (CT shows no signs of ILD, mixed chr bronchitic & emphysema pathology, incidental 1cm RLL nodule; Spirometry showed that his COPD is severe w/ FEV1=0.83 (24%);  We need to place him back on the PREDNISONE (lower dose, ie-20 then10) and wean it more slowly as his response was very good by his description & he did not use the rescue inhaler while on the Pred, but after he weaned off he found that he needed the AlbutHFA every 3-4H;  He knows that he must quit smoking- the Chantix + Nicoderm will likely cost him as much but we wrote the prescriptions as requested;  His 2DEcho is pending- we plan rov recheck in 26m27moooner if needed prn... NOTE:  >50% of this 36m48mov was spent in counseling & coordination of care...  ADDENDUM>>  2DEcho 11/08/16 showed norm LV w/ vigorous LVF & EF=65-70% w/ norm wall motion & no regional wall motion abn, Gr1DD, AoV poorly vis w/ at least mild AS (mean grad=126mm69m MV w/ triv MR,  RV size & function normal, PAsys=45mmH18m  we plan rov recheck, assess need for Home O2 at this point, monitor AS & PA press going forward...   ~  December 26, 2016:  26mo RO9mod pulmonary follow up visit> Jeff sShanon Cruz his breathing is good- stable on Rx w/ Pred'10mg'$ Qam, Anoro one inhalation daily, & AlbutHFA rescue inhaler as needed (averaging 3-4x per day);  He is still smoking ~5cig/d on ave & he did not try Chantix, chose the nicotine patches and has arranged for a hypnotist session coming up soon (he quit w/ hypnosis in the past)... He admits to a mild cough, small amt greenish sput in  the AMs, no hemoptysis; he says is SOB/DOE is better & noted mostly w/ lifting objects 7 carrying the weight; states ADLs, walking, stairs, yard are all ok (he's stoic); he denies CP/ palpit/ edema & his CC is LBP=> rec rest, heat, OTC pain meds + Robaxin 521m tid...  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    Cig smoker, <1/2ppd currently w/ 35 pack-yr smoking hx> He must quit smoking completely- he is going to re-try hypnosis, on nicotine replacement, declined chantix...    Hx exertional hypoxemia> aware, this has improved w/ inhalers and with less smoking; Ambulatory oximetry today 12/26/16 w/ lowest O2sat=91% after 3 laps on RA... ONO is pending...     Cardiac issues>  2Decho 8/18 showed norm LVF & wall motion, Gr1DD, AoV poorly vis w/ at least mild AS, PAsys~19mm95m.     Medical issues>  His PCP is DrJGolding & we do not have notes from him- Hx thrush, borderline BP, overweight, LBP...    EXAM shows Afeb, VSS, O2sat=90% on RA at rest;  Wt=187# (up 6#);  HEENT thrush resolved, +ear wax, no adenopathy;  Chest- bilat congestion/ sl wheezing/ +rhonchi;  Heart- RR w/o m/r/g detected;  Abd- soft nontender neg;  Ext- sl VI w/o c/c/e;  Neuro- intact;  Derm- "farmer's tan"...   Ambulatory oximetry 12/26/16>  O2sat=97% on RA at rest w/ HR=73/min;  He ambulated in the office on RA a total of 3 laps (185'ea) w/ lowest O2sat=91% w/ HR=86/min (improved from 7/18)...   Overnight Oximetry>  Done 01/09/17 => O2sat nadir= 81% & he spent 2h 18min51ms than 88% which qualifies him for O2 Qhs at 2L/min flow...  IMP/PLAN>>  Jeff Jeff Browstands the imperative to quit all smoking & he plans on hypnosis soon;  For now we will continue the Pred10Qam, Anoro once daily & prn Albuterol (reminded to use this more sparingly); his O2sat on RA w/ ambulation is improved & stays >90%-- we are planning an ONO as well... He needs his 2018 Flu vaccine & should get a pre-age64 Pneumovax 23 as well (?what he has received at PCPs office?)... Finally we wrote for Robaxin to try for his LBP & he will f/u w/ DrGolding...    Past Medical History:  Diagnosis Date  . Asthma     No past surgical history on file.   Outpatient Encounter Prescriptions as of 12/26/2016  Medication Sig  . Albuterol Sulfate (PROAIR RESPICLICK) 108 (9916ase) MCG/ACT AEPB Inhale 1-2 puffs into the lungs every 6 (six) hours as needed.  . fluconazole (DIFLUCAN) 100 MG tablet Take 1 tablet (100 mg total) by mouth daily.  . predniSONE (DELTASONE) 10 MG tablet Take 2 tablets every morning x 2 weeks, then take 1 tablet every morning thereafter.  . umeclidinium-vilanterol (ANORO ELLIPTA) 62.5-25 MCG/INH AEPB Inhale 1  puff  into the lungs daily.  . methocarbamol (ROBAXIN) 500 MG tablet Take 1 tablet (500 mg total) by mouth 4 (four) times daily.  . [DISCONTINUED] varenicline (CHANTIX CONTINUING MONTH PAK) 1 MG tablet Take 1 tablet (1 mg total) by mouth 2 (two) times daily. (Patient not taking: Reported on 12/26/2016)  . [DISCONTINUED] varenicline (CHANTIX STARTING MONTH PAK) 0.5 MG X 11 & 1 MG X 42 tablet Take one 0.5 mg tablet by mouth once daily for 3 days, then increase to one 0.5 mg tablet twice daily for 4 days, then increase to one 1 mg tablet twice daily. (Patient not taking: Reported on 12/26/2016)   No facility-administered encounter medications on file as of 12/26/2016.     No Known Allergies   Immunization History  Administered Date(s) Administered  . Influenza,inj,Quad PF,6+ Mos 12/28/2015    Current Medications, Allergies, Past Medical History, Past Surgical History, Family History, and Social History were reviewed in [employer] record.   Review of Systems          All symptoms NEG except where BOLDED >>  Constitutional:  F/C/S, fatigue, anorexia, unexpected weight change. HEENT:  HA, visual changes, hearing loss, earache, nasal symptoms, sore throat, mouth sores, hoarseness. Resp:  cough, sputum, hemoptysis; SOB, tightness, wheezing. Cardio:  CP, palpit, DOE, orthopnea, edema. GI:  N/V/D/C, blood in stool; reflux, abd pain, distention, gas. GU:  dysuria, freq, urgency, hematuria, flank pain, voiding difficulty. MS:  joint pain, swelling, tenderness, decr ROM; neck pain, back pain, etc. Neuro:  HA, tremors, seizures, dizziness, syncope, weakness, numbness, gait abn. 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 Psyche:  confusion, agitation, sleep disturbance, hallucinations, anxiety, depression suicidal.   Objective:   Physical Exam    Vital Signs:  Reviewed...   General:  WD,  WN, 51 y/o WM in NAD; alert & oriented; pleasant & cooperative... HEENT:  Leonore/AT; Conjunctiva- pink, Sclera- nonicteric, EOM-wnl, PERRLA; EACs-wax; NOSE-clear; THROAT- +thrush Neck:  Supple w/ fair ROM; no JVD; normal carotid impulses w/o bruits; no thyromegaly or nodules palpated; no lymphadenopathy.  Chest:  Slight decr BS at bases, bilat congested lungs w/ rhonchi & exp wheezing, no consolidation... Heart:  Regular Rhythm; norm S1 & S2 without murmurs, rubs, or gallops detected. Abdomen:  Soft & nontender- no guarding or rebound; normal bowel sounds; no organomegaly or masses palpated. Ext:  Normal ROM; without deformities or arthritic changes; no varicose veins, venous insuffic, or edema;  Pulses intact w/o bruits. Neuro:  CNs II-XII intact; motor testing normal; sensory testing normal; gait normal & balance OK, +LBP. Derm:  He is way too tanned- no lesions noted; no rash etc. Lymph:  No cervical, supraclavicular, axillary, or inguinal adenopathy palpated.   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, due to mixed chr bronchitis & emphysema    CT Chest w/o signs of ILD but pos for mixed chr bronchitic & emphysematous changes, plus a 1cm subsolid RLL nodule=> f/u scan in 30mo(approx [id number]    CT Chest also showed dilated main pulm art at 3.5cm-- r/o pulmHTN, screening 2DEcho w/ PAsys~419mg...    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;  He must quit all smoking- Chantix w/o help in past but hypnosis helped therefore "go for it" as the end justifies the means;  We discussed changing the Symbicort to [facility] one inhalation daily & continue the albutHFA rescue  inhaler as needed (we reviewed rinsing technique after the rx);  Add MUCINEX 680m 2Bid w/ fluids;  For his thrush we wrote for DIFLUCAN [id number] and rec an OTC Probiotic as well;  Finally we decided  To Rx w/ a PREDNISONE taper- see AVS;  He will ret for f/u 49m849mo  10/25/16>   We decided to hold on the FullPFTs for now & will order the Hi-res CT Chest for further evaluation;  We need to place him back on the PREDNISONE (lower dose, ie-20 then10) and wean it more slowly as his response was very good by his description & he did not use the rescue inhaler while on the Pred, but after he weaned off he found that he needed the AlbutHFA every 3-4H;  He knows that he must quit smoking- the Chantix + Nicoderm will likely cost him as much but we wrote the prescriptions as requested;  We plan rov recheck in 23mo649mooner if needed prn 12/26/16>  DaviShanon Browerstands the imperative to quit all smoking & he plans on hypnosis soon;  For now we will continue the Pred10Qam, Anoro once daily & prn Albuterol (reminded to use this more sparingly); his O2sat on RA w/ ambulation is improved & stays >90%-- we are planning an ONO as well... He needs his 2018 Flu vaccine & should get a pre-age65 Pneumovax 23 as well (?what he has received at PCPs office?)... Finally we wrote for Robaxin to try for his LBP & he will f/u w/ DrGolding.    Plan:     Patient's Medications  New Prescriptions   METHOCARBAMOL (ROBAXIN) 500 MG TABLET    Take 1 tablet (500 mg total) by mouth 4 (four) times daily.  Previous Medications   ALBUTEROL SULFATE (PROAIR RESPICLICK) 108 325 BASE) MCG/ACT AEPB    Inhale 1-2 puffs into the lungs every 6 (six) hours as needed.   FLUCONAZOLE (DIFLUCAN) 100 MG TABLET    Take 1 tablet (100 mg total) by mouth daily.   PREDNISONE (DELTASONE) 10 MG TABLET    Take 2 tablets every morning x 2 weeks, then take 1 tablet every morning thereafter.   UMECLIDINIUM-VILANTEROL (ANORO ELLIPTA) 62.5-25 MCG/INH AEPB    Inhale 1 puff  into the lungs daily.  Modified Medications   No medications on file  Discontinued Medications   VARENICLINE (CHANTIX CONTINUING MONTH PAK) 1 MG TABLET    Take 1 tablet (1 mg total) by mouth 2 (two) times daily.   VARENICLINE (CHANTIX STARTING MONTH PAK) 0.5 MG X 11 & 1 MG X 42 TABLET    Take one 0.5 mg tablet by mouth once daily for 3 days, then increase to one 0.5 mg tablet twice daily for 4 days, then increase to one 1 mg tablet twice daily.

## 2016-12-27 ENCOUNTER — Telehealth: Payer: Self-pay | Admitting: Pulmonary Disease

## 2016-12-27 NOTE — Telephone Encounter (Signed)
Spoke with [facility]ought, they will not cover oxygen for this pt because his insurance is out of network and they would not be able to supply him with oxygen. PCC's can we send this order to another DME. Thanks.

## 2016-12-27 NOTE — Telephone Encounter (Signed)
Order has now been sent to [facility] for the patient.

## 2016-12-27 NOTE — Telephone Encounter (Signed)
Called pt to let him know. Left message for pt to call back.  

## 2016-12-28 NOTE — Telephone Encounter (Signed)
Called and spoke with pt and he is aware of the change in DME due to his insurance.

## 2017-01-09 ENCOUNTER — Encounter: Payer: Self-pay | Admitting: Pulmonary Disease

## 2017-01-12 ENCOUNTER — Other Ambulatory Visit: Payer: Self-pay | Admitting: Pulmonary Disease

## 2017-02-09 ENCOUNTER — Telehealth: Payer: Self-pay | Admitting: Pulmonary Disease

## 2017-02-09 DIAGNOSIS — G4734 Idiopathic sleep related nonobstructive alveolar hypoventilation: Secondary | ICD-10-CM

## 2017-02-09 NOTE — Telephone Encounter (Signed)
Spoke with Barbara CowerJason and he states the pt does not have a covered benefit for oxygen with his insurance so he is going to give the patient the option of cash pay. It will run him around 120 dollars per month. FYI SN

## 2017-02-09 NOTE — Telephone Encounter (Signed)
LMTCB for patient-I have placed order for O2 as today is last day to order.     Pt called back-aware of order placed.

## 2017-02-16 ENCOUNTER — Telehealth: Payer: Self-pay | Admitting: Pulmonary Disease

## 2017-02-16 NOTE — Telephone Encounter (Signed)
Spoke with pt, he states he is not sure why they are not covering the oxygen, he would have to pay around 150 dollars a month. He wants to know how important it is to get because it is a big expense. I will call [facility] in the morning to follow up on this.

## 2017-02-17 NOTE — Telephone Encounter (Signed)
Spoke with Barbara CowerJason, he states his [employer] insurance does not cover DME at all. I wanted to let pt know this and advise him to call his insurance to make sure this is correct.   ATC pt, no answer. Left message for pt to call back.

## 2017-02-17 NOTE — Telephone Encounter (Signed)
Spoke with pt relaying the [facility] not covering a DME and that it would be around $150 a month for pt.  Pt expressed understanding. Nothing further needed.

## 2017-03-28 ENCOUNTER — Encounter: Payer: Self-pay | Admitting: Pulmonary Disease

## 2017-03-28 ENCOUNTER — Telehealth: Payer: Self-pay | Admitting: Pulmonary Disease

## 2017-03-28 ENCOUNTER — Ambulatory Visit (INDEPENDENT_AMBULATORY_CARE_PROVIDER_SITE_OTHER): Payer: 59 | Admitting: Pulmonary Disease

## 2017-03-28 VITALS — BP 132/88 | HR 66 | Temp 98.4°F | Ht 67.0 in | Wt 197.0 lb

## 2017-03-28 DIAGNOSIS — F17210 Nicotine dependence, cigarettes, uncomplicated: Secondary | ICD-10-CM

## 2017-03-28 DIAGNOSIS — J449 Chronic obstructive pulmonary disease, unspecified: Secondary | ICD-10-CM | POA: Diagnosis not present

## 2017-03-28 DIAGNOSIS — R918 Other nonspecific abnormal finding of lung field: Secondary | ICD-10-CM | POA: Diagnosis not present

## 2017-03-28 DIAGNOSIS — G4734 Idiopathic sleep related nonobstructive alveolar hypoventilation: Secondary | ICD-10-CM

## 2017-03-28 MED ORDER — UMECLIDINIUM-VILANTEROL 62.5-25 MCG/INH IN AEPB: 1.0000 | INHALATION_SPRAY | Freq: Every day | RESPIRATORY_TRACT | 11 refills | Status: DC

## 2017-03-28 MED ORDER — UMECLIDINIUM-VILANTEROL 62.5-25 MCG/INH IN AEPB: 1.0000 | INHALATION_SPRAY | Freq: Every day | RESPIRATORY_TRACT | 0 refills | Status: DC

## 2017-03-28 MED ORDER — ALBUTEROL SULFATE 108 (90 BASE) MCG/ACT IN AEPB: 1.0000 | INHALATION_SPRAY | Freq: Four times a day (QID) | RESPIRATORY_TRACT | 11 refills | Status: DC | PRN

## 2017-03-28 NOTE — Telephone Encounter (Signed)
Libby, please advise that we did get [facility] due to pt having [employer] insurance.  Also please advise that we got things taken care of with his O2.  Thanks!

## 2017-03-28 NOTE — Progress Notes (Addendum)
Subjective:     Patient ID: Jeff Cruz., male   DOB: 09/23/1965, 52 y.o.   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  ~  September 21, 2016:  Initial pulmonary consult by SN>  [name]) is referred by his family for a pulmonary eval- he is 52 y/o & a current smoker of 1ppd w/ a 35+pack-yr hx;  He knows that he needs to quit & previously quit for awhile after hypnosis by ?DrOxley, Chantix was w/o benefit he says;  His CC is SOB/ DOE which he feels is slowly getting worse & noticed w/ recreation like water skiing, & also w/ yard work Social research officer, government;  He has a mild AM cough, small amt yellow sput, no hemoptysis, no CP, no hx cardiac issues, he's been treated by PCP & UC for bronchitis in past; he doesn't recall prev CXR; PCP is DrJGolding & current meds include Symbicort160-2spBid, AlbutHFA prn...  Smoking Hx>  Started age ~89, up to 1ppd max, for a 35 pack-yr smoking hx at age 13...   Pulmonary Hx>  Notes occas bronchitic infections, no hx pneumonia, no hx asthma, etc;  No hosp for resp problems,   Medical Hx>  Epic record indicates:  Borderline HBP, "asthma", obesity, referred to [facility] for colonoscopy per DrGolding...  Family Hx>  + for DM, heart dis, & chr lung dis in mother...  Occup Hx>  He is a Fish farm manager  Current Meds>  Symbicort160-2spBid, AlbutHFA prn... EXAM shows Afeb, VSS, O2sat=95% on RA at rest;  Wt=176# (he's lost 50# over the last few yrs on diet);  HEENT +thrush, ear wax, no adenopathy;  Chest- bilat congestion/ wheezing/ rhonchi;  Heart- RR w/o m/r/g detected;  Abd- soft nontender neg;  Ext- sl VI w/o c/c/e;  Neuro- intact;  Derm- "farmer's tan"...   CXR 09/21/16 (independently reviewed by me in the PACS system) showed norm heart size,  hyperexpanded lungs w/ vasc attenuation c/w COPD/ emphysema- NAD...   Spirometry 09/21/16>  FVC=1.87 (43%), FEV1=0.83 (24%), %1sec=44%, mid-flows reduced at 11% predicted;  This is c/w severe airflow obstruction and GOLD Stage 4 COPD...  Ambulatory oximetry 09/21/16 on RA>  O2sat=94% on RA at rest w/ heart rate=70/min;  He ambulated 3 Laps in office (185'ea) w/ lowest O2sat=86% w/ pulse 101/min...  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;  He must quit all smoking- Chantix w/o help in past but hypnosis helped therefore "go for it" as the end justifies the means;  We discussed changing the Symbicort to [facility] one  inhalation daily & continue the AlbutHFA rescue inhaler as needed (we reviewed rinsing technique after the rx);  Add MUCINEX 68m 2Bid w/ fluids;  For his thrush we wrote for DIFLUCAN 10775mx7d and rec an OTC Probiotic as well;  Finally we decided  To Rx w/ a PREDNISONE taper- see AVS;  He will ret for f/u 75m109mo   ~  October 25, 2016:  75mo275mo & Jeff Cruz reports a great response to the Pred taper, plus Anoro & Mucinex;  He notes however that when the Pred ran out 1 week ago that his breathing deteriorated w/ some incr in cough, congestion, and SOB/DOE which required him to start using the rescue inhaler every 3-4H;  Unfortunately he is still smoking 1ppd &  has been unable to quit on his own- he checked w/ ?DrOxley but the cost is $400 & he is requesting a Rx for Chantix + rec for nicotine replacement therapy w/ patches, gum, or lozenges... We discussed the need for further evaluation w/ FullPFTs and a CT Chest...    EXAM shows Afeb, VSS, O2sat=95% on RA at rest;  Wt=181# (up 6#);  HEENT thrush resolved, +ear wax, no adenopathy;  Chest- bilat congestion/ sl wheezing/ +rhonchi;  Heart- RR w/o m/r/g detected;  Abd- soft nontender neg;  Ext- sl VI w/o c/c/e;  Neuro- intact;  Derm- "farmer's tan"...   Hi-res CT CHEST 11/03/16 showed norm heart size, dilated main pulm art at 3.5cm; no adenopathy, mild centrilob & paraseptal emphysema w/ diffuse bronchial wall thickening, scat tiny RUL granuloma and a 1cm subsolidRLL pulm nodule, granulomatous splenic calcif, mod thoracic spondylosis => we will proceed w/ 2DEcho to check for pulm HTN...  2DEcho - pending IMP/PLAN>>  We decided to hold on the FullPFTs for now & will order the Hi-res CT Chest for further evaluation (CT shows no signs of ILD, mixed chr bronchitic & emphysema pathology, incidental 1cm RLL nodule; Spirometry showed that his COPD is severe w/ FEV1=0.83 (24%);  We need to place him back on the PREDNISONE (lower dose, ie-20 then10) and wean it more slowly as his response was very good by his description & he did not use the rescue inhaler while on the Pred, but after he weaned off he found that he needed the AlbutHFA every 3-4H;  He knows that he must quit smoking- the Chantix + Nicoderm will likely cost him as much but we wrote the prescriptions as requested;  His 2DEcho is pending- we plan rov recheck in 88mo,56moner if needed prn... NOTE:  >50% of this 25min59m was spent in counseling & coordination of care...  ADDENDUM>>  2DEcho 11/08/16 showed norm LV w/ vigorous LVF & EF=65-70% w/ norm wall motion & no regional wall motion abn, Gr1DD, AoV poorly vis w/ at least mild AS (mean grad=12mmHg34mV w/ triv MR,  RV size & function normal, PAsys=45mmHg.94mwe plan rov recheck, assess need for Home O2 at this point, monitor AS & PA press going forward...  ~  December 26, 2016:  88mo ROV 57mopulmonary follow up visit> Jeff Cruz staShanon Browis breathing is good- stable on Rx w/ Pred10mgQam, 175mo one inhalation daily, & AlbutHFA rescue inhaler as needed (averaging 3-4x per day);  He is still smoking ~5cig/d on ave & he did not try Chantix, chose the nicotine patches and has arranged for a hypnotist session coming up soon (he quit w/ hypnosis in the past)... He admits to a mild cough, small amt greenish sput in the  AMs, no hemoptysis; he says is SOB/DOE is better & noted mostly w/ lifting objects 7 carrying the weight; states ADLs, walking, stairs, yard are all ok (he's stoic); he denies CP/ palpit/ edema & his CC is LBP=> rec rest, heat, OTC pain meds + Robaxin 543m tid...  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    Cig smoker, <1/2ppd currently w/ 35 pack-yr smoking hx> He must quit smoking completely- he is going to re-try hypnosis, on nicotine replacement, declined chantix...    Hx exertional hypoxemia> aware, this has improved w/ inhalers and with less smoking; Ambulatory oximetry today 12/26/16 w/ lowest O2sat=91% after 3 laps on RA... ONO is pending...     Cardiac issues>  2Decho 8/18 showed norm LVF & wall motion, Gr1DD, AoV poorly vis w/ at least mild AS, PAsys~85mm9m.     Medical issues>  His PCP is DrJGolding & we do not have notes from him- Hx thrush, borderline BP, overweight, LBP...    EXAM shows Afeb, VSS, O2sat=90% on RA at rest;  Wt=187# (up 6#);  HEENT thrush resolved, +ear wax, no adenopathy;  Chest- bilat congestion/ sl wheezing/ +rhonchi;  Heart- RR w/o m/r/g detected;  Abd- soft nontender neg;  Ext- sl VI w/o c/c/e;  Neuro- intact;  Derm- "farmer's tan"...   Ambulatory oximetry 12/26/16>  O2sat=97% on RA at rest w/ HR=73/min;  He ambulated in the office on RA a total of 3 laps (185'ea) w/ lowest O2sat=91% w/ HR=86/min (improved from 7/18)...   Overnight Oximetry>  Done 01/09/17 => O2sat nadir= 81% & he spent 2h 18min40ms than 88% which qualifies him for O2 Qhs at 2L/min flow...  IMP/PLAN>>  Jeff Cruz Jeff Browstands the imperative to quit all smoking & he plans on hypnosis soon;  For now we will continue the Pred10Qam, Anoro once daily & prn Albuterol (reminded to use this more sparingly); his O2sat on RA w/ ambulation is improved & stays >90%-- we are planning an ONO as well... He needs his 2018 Flu vaccine & should get a pre-age64 Pneumovax 23 as well (?what he has received at PCPs office?)... Finally we wrote for Robaxin to try for his LBP & he will f/u w/ DrGolding...   ~  March 28, 2017:  70mo RO40moDavid says he switched his insurance from [employer] [place] [place] [employer]) to [employer] ([place]) but now he has an issue w/ them covering his nocturnal O2 (?$150/mo) & we plan another ONO to get him qualified;  Jeff Cruz sShanon Browe had hypnosis yest & hasn't had a cig since=> knows he needs to quit & stay quit to help prolong his life!  He notes breathing is good & he's been off Pred for several wks now, on Anoro one daily, AlbutHFA prn but using it 3-4 x daily & asked to cut back;  Notes mild cough,  sm amt clear sput in AM, no hemoptysis, similar DOE- mostly w/ lifting &  carrying weight;  ADL's are ok & he denies CP/ palpit/ edema;  His CC remains LBP on Robaxin prn...  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> off Pred now, on ANORO one inhalation daily, AlbutHFA prn (still using 3-4xdaily);  Spirometry 7/18 w/ FEV1=0.83 (24%), and exerc induced hypoxemia w/ O2sat on RA=86% after 3laps (pulse=101/min);  Hi-res CT Chest 8/18 showed dilated main PA at 3.5cm, +centrilob & paraseptal emphysema + diffuse bronch wall thickening, RUL gran/ RLL 1cm nodule=> this nodule needs f/u noncontrast CT Chest in 57mo   RLL pulm nodule on CT Chest> 1cm subsolid RLL pulm nodule noted 8/18 CT Chest=> f/u 05/2017 w/ resolution of this nodule, +centrilob emphysema & bronchial wall thickening bilat w/ areas of small airway impaction in LLs, tiny subpleural nodules in RUL (calcif granulomas) & RLL are stable, +enlarged PAs as before...    Pulmonary HTN by 2DEcho w/ PAsys est~445mg on 10/2016...    Cig smoker, <1/2ppd currently w/ 35 pack-yr smoking hx> He must quit smoking completely- he is going to re-try hypnosis, on nicotine replacement, declined chantix...    Hx nocturnal & exertional hypoxemia> aware, this has improved w/ inhalers and with less smoking; Ambulatory oximetry 12/26/16 w/ lowest O2sat=91% after 3 laps on RA... ONO 10/18 w/ nadir O2sat=81% & 2H <88% qualifying him for O2; repeat ONO 1/19 showed nadir O2sat=84% & 23 min <88% therefore still needs nocturnal O2...    Cardiac issues>  2Decho 8/18 showed norm LVF & wall motion, Gr1DD, AoV poorly vis w/ at least mild AS, PAsys~4593m...     Medical issues>  His PCP is DrJGolding & we do not have notes from him- Hx thrush, borderline BP, overweight, LBP...    EXAM shows Afeb, VSS, O2sat=93% on RA at rest;  Wt=187# (up 6#);  HEENT thrush resolved, +ear wax, no adenopathy;  Chest- bilat congestion/ sl wheezing/  +rhonchi;  Heart- RR w/o m/r/g detected;  Abd- soft nontender neg;  Ext- sl VI w/o c/c/e;  Neuro- intact;  Derm- "farmer's tan"...   We discussed repeating Spirometry but he wants to wait due to $$ issues...  Overnight Oximetry done 04/04/17>  8.5H study showed O2sat <88% for 23 min, with nadir O2sat=84%;  He qualifies for nocturnal O2 under Group 1 criteria- and we will set up nocturnal O2 at 2L/min [place]...  IMP/PLAN>>  DavShanon Cruz slowly improved but w/ serious COPD underlying & still smoking (had hypnosis yest), must quit all smoking, continue O2/ Anoro/ AlbutHFA; we discussed exercise program vs PulmRehab but $$ is an issue; f/u CT Chest due in Feb...   ADDENDUM>>  CT Chest done 06/01/17>> norm heart size, prom pulm arts- query PAH, scat small mediastinal LNs- no change, centrilob emphysema & bronchial wall thickening bilat w/ areas of small airway impaction in LLs, tiny subpleural nodules in RUL (calcif granulomas) & RLL are stable & prev ill-defined 14m70mdule in medial RUL is gone!    Past Medical History:  Diagnosis Date  . Asthma     History reviewed. No pertinent surgical history.   Outpatient Encounter Medications as of 03/28/2017  Medication Sig  . Albuterol Sulfate (PROAIR RESPICLICK) 108 086 Base) MCG/ACT AEPB Inhale 1-2 puffs into the lungs every 6 (six) hours as needed.  . methocarbamol (ROBAXIN) 500 MG  tablet Take 1 tablet (500 mg total) by mouth 4 (four) times daily.  Marland Kitchen umeclidinium-vilanterol (ANORO ELLIPTA) 62.5-25 MCG/INH AEPB Inhale 1 puff into the lungs daily.  . [DISCONTINUED] Albuterol Sulfate (PROAIR RESPICLICK) 389 (90 Base) MCG/ACT AEPB Inhale 1-2 puffs into the lungs every 6 (six) hours as needed.  . [DISCONTINUED] fluconazole (DIFLUCAN) 100 MG tablet Take 1 tablet (100 mg total) by mouth daily.  . [DISCONTINUED] predniSONE (DELTASONE) 10 MG tablet Take 1 tablet (10 mg total) daily with breakfast by mouth.  . [DISCONTINUED] umeclidinium-vilanterol (ANORO ELLIPTA)  62.5-25 MCG/INH AEPB Inhale 1 puff into the lungs daily.  . [DISCONTINUED] umeclidinium-vilanterol (ANORO ELLIPTA) 62.5-25 MCG/INH AEPB Inhale 1 puff into the lungs daily.   No facility-administered encounter medications on file as of 03/28/2017.     No Known Allergies   Immunization History  Administered Date(s) Administered  . Influenza,inj,Quad PF,6+ Mos 12/28/2015, 12/26/2016    Current Medications, Allergies, Past Medical History, Past Surgical History, Family History, and Social History were reviewed in [employer] record.   Review of Systems          All symptoms NEG except where BOLDED >>  Constitutional:  F/C/S, fatigue, anorexia, unexpected weight change. HEENT:  HA, visual changes, hearing loss, earache, nasal symptoms, sore throat, mouth sores, hoarseness. Resp:  cough, sputum, hemoptysis; SOB, tightness, wheezing. Cardio:  CP, palpit, DOE, orthopnea, edema. GI:  N/V/D/C, blood in stool; reflux, abd pain, distention, gas. GU:  dysuria, freq, urgency, hematuria, flank pain, voiding difficulty. MS:  joint pain, swelling, tenderness, decr ROM; neck pain, back pain, etc. Neuro:  HA, tremors, seizures, dizziness, syncope, weakness, numbness, gait abn. 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 Psyche:  confusion, agitation, sleep disturbance, hallucinations, anxiety, depression suicidal.   Objective:   Physical Exam    Vital Signs:  Reviewed...   General:  WD, WN, 53 y/o WM in NAD; alert & oriented; pleasant & cooperative... HEENT:  [place]/AT; Conjunctiva- pink, Sclera- nonicteric, EOM-wnl, PERRLA; EACs-wax; NOSE-clear; THROAT- +thrush Neck:  Supple w/ fair ROM; no JVD; normal carotid impulses w/o bruits; no thyromegaly or nodules palpated; no lymphadenopathy.  Chest:  Slight decr BS at bases, bilat congested lungs w/ rhonchi & exp wheezing, no consolidation... Heart:   Regular Rhythm; norm S1 & S2 without murmurs, rubs, or gallops detected. Abdomen:  Soft & nontender- no guarding or rebound; normal bowel sounds; no organomegaly or masses palpated. Ext:  Normal ROM; without deformities or arthritic changes; no varicose veins, venous insuffic, or edema;  Pulses intact w/o bruits. Neuro:  CNs II-XII intact; motor testing normal; sensory testing normal; gait normal & balance OK, +LBP. Derm:  He is way too tanned- no lesions noted; no rash etc. Lymph:  No cervical, supraclavicular, axillary, or inguinal adenopathy palpated.   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, due to mixed chr bronchitis & emphysema    CT Chest w/o signs of ILD but pos for mixed chr bronchitic & emphysematous changes, plus a 1cm subsolid RLL nodule=> f/u scan in 61mo(approx [id number]    CT Chest also showed dilated main pulm art at 3.5cm-- r/o pulmHTN, screening 2DEcho w/ PAsys~459mg...    Cig smoker, <1/2ppd currently w/ 35 pack-yr smoking hx    Exertional hypoxemia=> improved, ONO pending     Cardiac issues>  At least mild AS on  2DEcho    Medical issues>  Trush resolved, LBP per PCP...  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;  He must quit all smoking- Chantix w/o help in past but hypnosis helped therefore "go for it" as the end justifies the means;  We discussed changing the Symbicort to [facility] one inhalation daily & continue the albutHFA rescue inhaler as needed (we reviewed rinsing technique after the rx);  Add MUCINEX 635m 2Bid w/ fluids;  For his thrush we wrote for DIFLUCAN 1020mx7d and rec an OTC Probiotic as well;  Finally we decided  To Rx w/ a PREDNISONE taper- see AVS;  He will ret for f/u 20m103mo  10/25/16>   We decided to hold on the FullPFTs for now & will order the Hi-res CT Chest for further evaluation;  We need to place him back on the  PREDNISONE (lower dose, ie-20 then10) and wean it more slowly as his response was very good by his description & he did not use the rescue inhaler while on the Pred, but after he weaned off he found that he needed the AlbutHFA every 3-4H;  He knows that he must quit smoking- the Chantix + Nicoderm will likely cost him as much but we wrote the prescriptions as requested;  We plan rov recheck in 53mo420mooner if needed prn 12/26/16>  DaviShanon Browerstands the imperative to quit all smoking & he plans on hypnosis soon;  For now we will continue the Pred10Qam, Anoro once daily & prn Albuterol (reminded to use this more sparingly); his O2sat on RA w/ ambulation is improved & stays >90%-- we are planning an ONO as well... He needs his 2018 Flu vaccine & should get a pre-age64 Pneumovax 23 as well (?what he has received at PCPs office?)... Finally we wrote for Robaxin to try for his LBP & he will f/u w/ DrGolding.  03/28/17>   DaviShanon Browslowly improved but w/ serious COPD underlying & still smoking (had hypnosis yest), must quit all smoking, continue O2/ Anoro/ AlbutHFA; we discussed exercise program vs PulmRehab but $$ is an issue; f/u CT Chest due in Feb...   Plan:     Patient's Medications  New Prescriptions   No medications on file  Previous Medications   METHOCARBAMOL (ROBAXIN) 500 MG TABLET    Take 1 tablet (500 mg total) by mouth 4 (four) times daily.  Modified Medications   Modified Medication Previous Medication   ALBUTEROL SULFATE (PROAIR RESPICLICK) 108 716 BASE) MCG/ACT AEPB Albuterol Sulfate (PROAIR RESPICLICK) 108 967 Base) MCG/ACT AEPB      Inhale 1-2 puffs into the lungs every 6 (six) hours as needed.    Inhale 1-2 puffs into the lungs every 6 (six) hours as needed.   UMECLIDINIUM-VILANTEROL (ANORO ELLIPTA) 62.5-25 MCG/INH AEPB umeclidinium-vilanterol (ANORO ELLIPTA) 62.5-25 MCG/INH AEPB      Inhale 1 puff into the lungs daily.    Inhale 1 puff into the lungs daily.  Discontinued Medications    FLUCONAZOLE (DIFLUCAN) 100 MG TABLET    Take 1 tablet (100 mg total) by mouth daily.   PREDNISONE (DELTASONE) 10 MG TABLET    Take 1 tablet (10 mg total) daily with breakfast by mouth.

## 2017-03-28 NOTE — Patient Instructions (Signed)
Today we updated your med list in our EPIC system...    Continue your current medications the same...    We removed the Prednisone from your med list...  Congrats on the hypnosis & not smoking since then!!!    Keep up the good work...  We are checking w/ the DME company regarding our order for OXYGEN at night...    We will "go to the mat" for you against the insurance company to arrange this important "medication" for your condition.  We will arrange for a CT Chest scan toward the end of Feb as a 6 month follow up from the initial scan in Aug 2018...     This is to recheck the nodule seen on the 1st scan...  Call for any questions...  Let's plan a follow up visit in 95mo, sooner if needed for problems.Marland Kitchen..Marland Kitchen

## 2017-03-29 NOTE — Telephone Encounter (Signed)
Pt ha been changed to [facility] they do take his ins he will do another ono and then get set up with his 02 Jeff Cruz

## 2017-04-04 ENCOUNTER — Encounter: Payer: Self-pay | Admitting: Pulmonary Disease

## 2017-04-10 ENCOUNTER — Ambulatory Visit (HOSPITAL_COMMUNITY): Payer: 59

## 2017-04-11 ENCOUNTER — Telehealth: Payer: Self-pay | Admitting: Pulmonary Disease

## 2017-04-11 DIAGNOSIS — G4734 Idiopathic sleep related nonobstructive alveolar hypoventilation: Secondary | ICD-10-CM

## 2017-04-11 DIAGNOSIS — J449 Chronic obstructive pulmonary disease, unspecified: Secondary | ICD-10-CM

## 2017-05-16 ENCOUNTER — Other Ambulatory Visit: Payer: Self-pay | Admitting: Pulmonary Disease

## 2017-05-16 DIAGNOSIS — R911 Solitary pulmonary nodule: Secondary | ICD-10-CM

## 2017-05-23 ENCOUNTER — Ambulatory Visit (HOSPITAL_COMMUNITY): Payer: 59

## 2017-05-23 ENCOUNTER — Telehealth: Payer: Self-pay | Admitting: Pulmonary Disease

## 2017-05-23 DIAGNOSIS — R911 Solitary pulmonary nodule: Secondary | ICD-10-CM

## 2017-05-23 NOTE — Telephone Encounter (Signed)
Spoke with pt. States that he can't afford to have a CT at this time. [facility]. The pt is willing to have the CT done if it wasn't so expensive. I advised the pt that it's so expensive because when you do a CT in a hospital setting, you are charged a hospital facility charge. Pt would [facility] Dr. Kriste BasqueNadel is agreeable to the above. A new order will be placed. Pt was very appreciative of our help, I advised him that if the CT is still to expensive to let us know. Nothing further was needed.

## 2017-05-31 ENCOUNTER — Ambulatory Visit (INDEPENDENT_AMBULATORY_CARE_PROVIDER_SITE_OTHER)
Admission: RE | Admit: 2017-05-31 | Discharge: 2017-05-31 | Disposition: A | Discharge status: Home / Self Care | Payer: 59 | Source: Ambulatory Visit | Attending: Pulmonary Disease | Admitting: Pulmonary Disease

## 2017-05-31 DIAGNOSIS — R911 Solitary pulmonary nodule: Secondary | ICD-10-CM | POA: Diagnosis not present

## 2017-07-27 ENCOUNTER — Ambulatory Visit: Payer: 59 | Admitting: Pulmonary Disease

## 2017-08-22 ENCOUNTER — Encounter: Payer: Self-pay | Admitting: Pulmonary Disease

## 2017-08-22 ENCOUNTER — Ambulatory Visit (INDEPENDENT_AMBULATORY_CARE_PROVIDER_SITE_OTHER): Payer: 59 | Admitting: Pulmonary Disease

## 2017-08-22 VITALS — BP 138/72 | HR 86 | Temp 97.7°F | Ht 67.0 in | Wt 193.2 lb

## 2017-08-22 DIAGNOSIS — G4734 Idiopathic sleep related nonobstructive alveolar hypoventilation: Secondary | ICD-10-CM | POA: Diagnosis not present

## 2017-08-22 DIAGNOSIS — F17210 Nicotine dependence, cigarettes, uncomplicated: Secondary | ICD-10-CM

## 2017-08-22 DIAGNOSIS — J449 Chronic obstructive pulmonary disease, unspecified: Secondary | ICD-10-CM

## 2017-08-22 DIAGNOSIS — R9389 Abnormal findings on diagnostic imaging of other specified body structures: Secondary | ICD-10-CM

## 2017-08-22 NOTE — Progress Notes (Signed)
Subjective:     Patient ID: Jeff Jenkins., male   DOB: 09/23/1965, 52 y.o.   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  ~  September 21, 2016:  Initial pulmonary consult by SN>  Jeff Cruz Jeff Cruz) is referred by his family for a pulmonary eval- he is 52 y/o & a current smoker of 1ppd w/ a 35+pack-yr hx;  He knows that he needs to quit & previously quit for awhile after hypnosis by ?DrOxley, Chantix was w/o benefit he says;  His CC is SOB/ DOE which he feels is slowly getting worse & noticed w/ recreation like water skiing, & also w/ yard work Social research officer, government;  He has a mild AM cough, small amt yellow sput, no hemoptysis, no CP, no hx cardiac issues, he's been treated by PCP & UC for bronchitis in past; he doesn't recall prev CXR; PCP is DrJGolding & current meds include Symbicort160-2spBid, AlbutHFA prn...  Smoking Hx>  Started age ~89, up to 1ppd max, for a 35 pack-yr smoking hx at age 13...   Pulmonary Hx>  Notes occas bronchitic infections, no hx pneumonia, no hx asthma, etc;  No hosp for resp problems,   Medical Hx>  Epic record indicates:  Borderline HBP, "asthma", obesity, referred to [facility] for colonoscopy per DrGolding...  Family Hx>  + for DM, heart dis, & chr lung dis in mother...  Occup Hx>  He is a Fish farm manager  Current Meds>  Symbicort160-2spBid, AlbutHFA prn... EXAM shows Afeb, VSS, O2sat=95% on RA at rest;  Wt=176# (he's lost 50# over the last few yrs on diet);  HEENT +thrush, ear wax, no adenopathy;  Chest- bilat congestion/ wheezing/ rhonchi;  Heart- RR w/o m/r/g detected;  Abd- soft nontender neg;  Ext- sl VI w/o c/c/e;  Neuro- intact;  Derm- "farmer's tan"...   CXR 09/21/16 (independently reviewed by me in the PACS system) showed norm heart size,  hyperexpanded lungs w/ vasc attenuation c/w COPD/ emphysema- NAD...   Spirometry 09/21/16>  FVC=1.87 (43%), FEV1=0.83 (24%), %1sec=44%, mid-flows reduced at 11% predicted;  This is c/w severe airflow obstruction and GOLD Stage 4 COPD...  Ambulatory oximetry 09/21/16 on RA>  O2sat=94% on RA at rest w/ heart rate=70/min;  He ambulated 3 Laps in office (185'ea) w/ lowest O2sat=86% w/ pulse 101/min...  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;  He must quit all smoking- Chantix w/o help in past but hypnosis helped therefore "go for it" as the end justifies the means;  We discussed changing the Symbicort to [facility] one  inhalation daily & continue the AlbutHFA rescue inhaler as needed (we reviewed rinsing technique after the rx);  Add MUCINEX 675m 2Bid w/ fluids;  For his thrush we wrote for DIFLUCAN 1023mx7d and rec an OTC Probiotic as well;  Finally we decided  To Rx w/ a PREDNISONE taper- see AVS;  He will ret for f/u 73m46mo   ~  October 25, 2016:  73mo473mo & Jeff Cruz reports a great response to the Pred taper, plus Anoro & Mucinex;  He notes however that when the Pred ran out 1 week ago that his breathing deteriorated w/ some incr in cough, congestion, and SOB/DOE which required him to start using the rescue inhaler every 3-4H;  Unfortunately he is still smoking 1ppd &  has been unable to quit on his own- he checked w/ ?DrOxley but the cost is $400 & he is requesting a Rx for Chantix + rec for nicotine replacement therapy w/ patches, gum, or lozenges... We discussed the need for further evaluation w/ FullPFTs and a CT Chest...    EXAM shows Afeb, VSS, O2sat=95% on RA at rest;  Wt=181# (up 6#);  HEENT thrush resolved, +ear wax, no adenopathy;  Chest- bilat congestion/ sl wheezing/ +rhonchi;  Heart- RR w/o m/r/g detected;  Abd- soft nontender neg;  Ext- sl VI w/o c/c/e;  Neuro- intact;  Derm- "farmer's tan"...   Hi-res CT CHEST 11/03/16 showed norm heart size, dilated main pulm art at 3.5cm; no adenopathy, mild centrilob & paraseptal emphysema w/ diffuse bronchial wall thickening, scat tiny RUL granuloma and a 1cm subsolidRLL pulm nodule, granulomatous splenic calcif, mod thoracic spondylosis => we will proceed w/ 2DEcho to check for pulm HTN...  2DEcho - pending IMP/PLAN>>  We decided to hold on the FullPFTs for now & will order the Hi-res CT Chest for further evaluation (CT shows no signs of ILD, mixed chr bronchitic & emphysema pathology, incidental 1cm RLL nodule; Spirometry showed that his COPD is severe w/ FEV1=0.83 (24%);  We need to place him back on the PREDNISONE (lower dose, ie-20 then10) and wean it more slowly as his response was very good by his description & he did not use the rescue inhaler while on the Pred, but after he weaned off he found that he needed the AlbutHFA every 3-4H;  He knows that he must quit smoking- the Chantix + Nicoderm will likely cost him as much but we wrote the prescriptions as requested;  His 2DEcho is pending- we plan rov recheck in 150mo,8150moner if needed prn... NOTE:  >50% of this 25min104m was spent in counseling & coordination of care...  ADDENDUM>>  2DEcho 11/08/16 showed norm LV w/ vigorous LVF & EF=65-70% w/ norm wall motion & no regional wall motion abn, Gr1DD, AoV poorly vis w/ at least mild AS (mean grad=12mmHg24mV w/ triv MR,  RV size & function normal, PAsys=45mmHg.74mwe plan rov recheck, assess need for Home O2 at this point, monitor AS & PA press going forward...  ~  December 26, 2016:  150mo ROV 34mopulmonary follow up visit> Jeff Cruz staShanon Browis breathing is good- stable on Rx w/ Pred10mgQam, 473mo one inhalation daily, & AlbutHFA rescue inhaler as needed (averaging 3-4x per day);  He is still smoking ~5cig/d on ave & he did not try Chantix, chose the nicotine patches and has arranged for a hypnotist session coming up soon (he quit w/ hypnosis in the past)... He admits to a mild cough, small amt greenish sput in the  AMs, no hemoptysis; he says is SOB/DOE is better & noted mostly w/ lifting objects 7 carrying the weight; states ADLs, walking, stairs, yard are all ok (he's stoic); he denies CP/ palpit/ edema & his CC is LBP=> rec rest, heat, OTC pain meds + Robaxin 565m tid...  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    Cig smoker, <1/2ppd currently w/ 35 pack-yr smoking hx> He must quit smoking completely- he is going to re-try hypnosis, on nicotine replacement, declined chantix...    Hx exertional hypoxemia> aware, this has improved w/ inhalers and with less smoking; Ambulatory oximetry today 12/26/16 w/ lowest O2sat=91% after 3 laps on RA... ONO is pending...     Cardiac issues>  2Decho 8/18 showed norm LVF & wall motion, Gr1DD, AoV poorly vis w/ at least mild AS, PAsys~57mm35m.     Medical issues>  His PCP is DrJGolding & we do not have notes from him- Hx thrush, borderline BP, overweight, LBP...    EXAM shows Afeb, VSS, O2sat=90% on RA at rest;  Wt=187# (up 6#);  HEENT thrush resolved, +ear wax, no adenopathy;  Chest- bilat congestion/ sl wheezing/ +rhonchi;  Heart- RR w/o m/r/g detected;  Abd- soft nontender neg;  Ext- sl VI w/o c/c/e;  Neuro- intact;  Derm- "farmer's tan"...   Ambulatory oximetry 12/26/16>  O2sat=97% on RA at rest w/ HR=73/min;  He ambulated in the office on RA a total of 3 laps (185'ea) w/ lowest O2sat=91% w/ HR=86/min (improved from 7/18)...   Overnight Oximetry>  Done 01/09/17 => O2sat nadir= 81% & he spent 2h 18min53ms than 88% which qualifies him for O2 Qhs at 2L/min flow...  IMP/PLAN>>  Jeff Cruz Jeff Browstands the imperative to quit all smoking & he plans on hypnosis soon;  For now we will continue the Pred10Qam, Anoro once daily & prn Albuterol (reminded to use this more sparingly); his O2sat on RA w/ ambulation is improved & stays >90%-- we are planning an ONO as well... He needs his 2018 Flu vaccine & should get a pre-age64 Pneumovax 23 as well (?what he has received at PCPs office?)... Finally we wrote for Robaxin to try for his LBP & he will f/u w/ DrGolding... $ ~  March 28, 2017:  71mo RO44moDavid says he switched his insurance from [employer] ($[place] [employer]) to [employer] ([place]) but now he has an issue w/ them covering his nocturnal O2 (?$150/mo) & we plan another ONO to get him qualified;  Jeff Cruz sShanon Browe had hypnosis yest & hasn't had a cig since=> knows he needs to quit & stay quit to help prolong his life!  He notes breathing is good & he's been off Pred for several wks now, on Anoro one daily, AlbutHFA prn but using it 3-4 x daily & asked to cut back;  Notes mild cough, sm  amt clear sput in AM, no hemoptysis, similar DOE- mostly w/ lifting &  carrying weight;  ADL's are ok & he denies CP/ palpit/ edema;  His CC remains LBP on Robaxin prn...  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> off Pred now, on ANORO one inhalation daily, AlbutHFA prn (still using 3-4xdaily);  Spirometry 7/18 w/ FEV1=0.83 (24%), and exerc induced hypoxemia w/ O2sat on RA=86% after 3laps (pulse=101/min);  Hi-res CT Chest 8/18 showed dilated main PA at 3.5cm, +centrilob & paraseptal emphysema + diffuse bronch wall thickening, RUL gran/ RLL 1cm nodule=> this nodule needs f/u noncontrast CT Chest in 31mo   RLL pulm nodule on CT Chest> 1cm subsolid RLL pulm nodule noted 8/18 CT Chest=> f/u 05/2017 w/ resolution of this nodule, +centrilob emphysema & bronchial wall thickening bilat w/ areas of small airway impaction in LLs, tiny subpleural nodules in RUL (calcif granulomas) & RLL are stable, +enlarged PAs as before...    Pulmonary HTN by 2DEcho w/ PAsys est~469mg on 10/2016- he is on nocturnal O2 at 2L/min & prn days...    Cig smoker, <1/2ppd currently w/ 35 pack-yr smoking hx> He must quit smoking completely- he is going to re-try hypnosis, on nicotine replacement, declined chantix...    Hx nocturnal & exertional hypoxemia> aware, this has improved w/ inhalers and with less smoking; Ambulatory oximetry 10/18 w/ lowest O2sat=91% after 3 laps on RA... ONO 10/18 w/ nadir O2sat=81% & 2H <88% qualifying him for O2; repeat ONO 1/19 showed nadir O2sat=84% & 23 min <88% therefore still qualifies for his nocturnal O2 (+the pulmHTN)...    Cardiac issues>  2Decho 8/18 showed norm LVF & wall motion, Gr1DD, AoV poorly vis w/ at least mild AS, PAsys~4526m...     Medical issues>  His PCP is DrJGolding & we do not have notes from him- Hx thrush, borderline BP, overweight, LBP...    EXAM shows Afeb, VSS, O2sat=93% on RA at rest;  Wt=187# (up 6#);  HEENT thrush  resolved, +ear wax, no adenopathy;  Chest- bilat congestion/ sl wheezing/ +rhonchi;  Heart- RR w/o m/r/g detected;  Abd- soft nontender neg;  Ext- sl VI w/o c/c/e;  Neuro- intact;  Derm- "farmer's tan"...   We discussed repeating Spirometry but he wants to wait due to $$ issues...  Overnight Oximetry done 04/04/17>  8.5H study showed O2sat <88% for 23 min, with nadir O2sat=84%;  He qualifies for nocturnal O2 under Group 1 criteria- and we will set up nocturnal O2 at 2L/min [place]...  IMP/PLAN>>  DavShanon Cruz slowly improved but w/ serious COPD underlying & still smoking (had hypnosis yest), must quit all smoking, continue O2/ Anoro/ AlbutHFA; we discussed exercise program vs PulmRehab but $$ is an issue; f/u CT Chest due in Feb...   ADDENDUM>>  CT Chest done 06/01/17>> norm heart size, prom pulm arts- query PAH, scat small mediastinal LNs- no change, centrilob emphysema & bronchial wall thickening bilat w/ areas of small airway impaction in LLs, tiny subpleural nodules in RUL (calcif granulomas) & RLL are stable & prev ill-defined 42m78mdule in medial RUL is gone!   ~  August 22, 2017:  74mo 21mo& Jeff Cruz reports that he quit smoking ~40mo a31mo/ hypnosis and nicoderm patches;  He feels that his breathing is a lot better "I have better wind" he says, less SOB, less DOE w/ lifting & carrying;  Notes mild cough, min clear sput, no hemoptysis and he denies CP/ palpit/ dizziness, edema... He remains on O2 at 2L/min Qhs, ANORO one puff daily, PROAIR-HFA prn but still using 3-4 times daily, & I note that he is NOT using any GFN- asked to restart GFN (Mucus Relief) 46m- 2Tid w/ fluids;  He is off PRED but notes that he feels he did sl better ON this med... 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> off Pred now, on ANORO one inhalation daily, AlbutHFA prn (still using 3-4xdaily);  Spirometry 7/18  w/ FEV1=0.83 (24%), and exerc induced hypoxemia w/ O2sat on RA=86% after 3laps (pulse=101/min);  Hi-res CT Chest 8/18 showed dilated main PA at 3.5cm, +centrilob & paraseptal emphysema + diffuse bronch wall thickening, RUL gran/ RLL 1cm nodule=> this nodule needs f/u noncontrast CT Chest in 628mo  RLL pulm nodule on CT Chest> 1cm subsolid RLL pulm nodule noted 8/18 CT Chest=> f/u 05/2017 w/ resolution of this nodule, +centrilob emphysema & bronchial wall thickening bilat w/ areas of small airway impaction in LLs, tiny subpleural nodules in RUL (calcif granulomas) & RLL are stable, +enlarged PAs as before...    Pulmonary HTN by 2DEcho w/ PAsys est~4554m on 10/2016- he is on nocturnal O2 at 2L/min & prn days...    Ex-Cig smoker, w/ 35 pack-yr smoking hx, quit 06/2017 after hypnosis & nicoderm> He knows he must quit smoking completely & stay quit...     Hx nocturnal & exertional hypoxemia> aware, this has improved w/ inhalers and with less smoking; Ambulatory oximetry 10/18 w/ lowest O2sat=91% after 3 laps on RA... ONO 10/18 w/ nadir O2sat=81% & 2H <88% qualifying him for O2; repeat ONO 1/19 showed nadir O2sat=84% & 23 min <88% therefore still qualifies for his nocturnal O2 (+the pulmHTN)...    Cardiac issues>  2Decho 8/18 showed norm LVF & wall motion, Gr1DD, AoV poorly vis w/ at least mild AS, PAsys~45m36m..     Medical issues>  His PCP is DrJGolding & we do not have notes from him- Hx thrush, borderline BP, overweight, LBP...    EXAM shows Afeb, VSS, O2sat=96% on RA at rest;  Wt=193# (up 6#);  HEENT thrush resolved, +ear wax, no adenopathy;  Chest- bilat congestion/ sl wheezing/ +rhonchi;  Heart- RR w/o m/r/g detected;  Abd- soft nontender neg;  Ext- sl VI w/o c/c/e;  Neuro- intact;  Derm- "farmer's tan" w/o lesions...   Spirometry 08/22/17>  FVC=1.95 (43%), FEV1=1.02 (29%), %1sec=53%, mid-flows reduced at 17% predicted; this is sl improved from 09/2016 PFT where FEV1=0.83... IMP/PLAN>>  DaviShanon Cruz GOLD Stage  3-4 COPD w/ nocturnal hypoxemia and secondary pulmHTN on prev 2DEcho;  Clinically & functionally improved on meds & with smoking cessation;  Asked to continue the O2, Anoro, ProairHFA, and start the GFN400-2Tid w/ fluids;  He knows that he must remain off the cigarettes!    Past Medical History:  Diagnosis Date  . Asthma     No past surgical history on file.   Outpatient Encounter Medications as of 08/22/2017  Medication Sig  . Albuterol Sulfate (PROAIR RESPICLICK) 108 967 Base) MCG/ACT AEPB Inhale 1-2 puffs into the lungs every 6 (six) hours as needed.  . methocarbamol (ROBAXIN) 500 MG tablet Take 1 tablet (500 mg total) by mouth 4 (four) times daily.  . umMarland Kitchenclidinium-vilanterol (ANORO ELLIPTA) 62.5-25 MCG/INH AEPB Inhale 1 puff into the  lungs daily.   No facility-administered encounter medications on file as of 08/22/2017.     No Known Allergies   Immunization History  Administered Date(s) Administered  . Influenza,inj,Quad PF,6+ Mos 12/28/2015, 12/26/2016    Current Medications, Allergies, Past Medical History, Past Surgical History, Family History, and Social History were reviewed in [employer] record.   Review of Systems          All symptoms NEG except where BOLDED >>  Constitutional:  F/C/S, fatigue, anorexia, unexpected weight change. HEENT:  HA, visual changes, hearing loss, earache, nasal symptoms, sore throat, mouth sores, hoarseness. Resp:  cough, sputum, hemoptysis; SOB, tightness, wheezing. Cardio:  CP, palpit, DOE, orthopnea, edema. GI:  N/V/D/C, blood in stool; reflux, abd pain, distention, gas. GU:  dysuria, freq, urgency, hematuria, flank pain, voiding difficulty. MS:  joint pain, swelling, tenderness, decr ROM; neck pain, back pain, etc. Neuro:  HA, tremors, seizures, dizziness, syncope, weakness, numbness, gait abn. Skin:  suspicious lesions or skin rash, he is way too tanned from working in the sun Heme:  adenopathy, bruising,  bleeding. Psyche:  confusion, agitation, sleep disturbance, hallucinations, anxiety, depression suicidal.   Objective:   Physical Exam    Vital Signs:  Reviewed...   General:  WD, WN, 52 y/o WM in NAD; alert & oriented; pleasant & cooperative... HEENT:  [place]/AT; Conjunctiva- pink, Sclera- nonicteric, EOM-wnl, PERRLA; EACs-wax; NOSE-clear; THROAT- +thrush Neck:  Supple w/ fair ROM; no JVD; normal carotid impulses w/o bruits; no thyromegaly or nodules palpated; no lymphadenopathy.  Chest:  Slight decr BS at bases, bilat congested lungs w/ scat rhonchi & exp wheezing, no consolidation... Heart:  Regular Rhythm; norm S1 & S2 without murmurs, rubs, or gallops detected. Abdomen:  Soft & nontender- no guarding or rebound; normal bowel sounds; no organomegaly or masses palpated. Ext:  Normal ROM; without deformities or arthritic changes; no varicose veins, venous insuffic, or edema;  Pulses intact w/o bruits. Neuro:  CNs II-XII intact; motor testing normal; sensory testing normal; gait normal & balance OK, +LBP. Derm:  He is way too tanned- no lesions noted; no rash etc. Lymph:  No cervical, supraclavicular, axillary, or inguinal adenopathy palpated.   Assessment:      IMP >>    Severe airflow obstruction c/w GOLD Stage 3-4 COPD, due to mixed chr bronchitis & emphysema    CT Chest w/o signs of ILD but pos for mixed chr bronchitic & emphysematous changes, plus a 1cm subsolid RLL nodule=> f/u scan [id number] showed nodule resolved...    CT Chest also showed dilated main pulm art at 3.5cm-- r/o pulmHTN, screening 2DEcho w/ PAsys~23mHg=> he remains on O2 at 2L/min Qhs...    Cig smoker, <1/2ppd currently w/ 35 pack-yr smoking hx=> he quit smoking 06/2017 after hypnosis & nicoderm patches...    Exertional & Nocturnal hypoxemia=> he remains on nocturnal O2 but his ambulatory oximetry improved w/ treatment...    Cardiac issues>  At least mild AS on 2DEcho    Medical issues>  Trush resolved, LBP (rx per  PCP)...  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 & continue the albutHFA rescue inhaler as needed (we reviewed rinsing technique after the rx);  Add MUCINEX 678m 2Bid w/ fluids;  For his thrush we wrote for DIFLUCAN 1062mx7d and rec an OTC Probiotic as well;  Finally we decided  To Rx w/ a PREDNISONE taper- see AVS;  He will ret for f/u 47m15mo  10/25/16>   We decided to hold on the FullPFTs for now & will order the Hi-res CT Chest for further evaluation;  We need to place him back on the PREDNISONE (lower dose, ie-20 then10) and wean it more slowly as his response was very good by his description & he did not use the rescue inhaler while on the Pred, but after he weaned off he found that he needed the AlbutHFA every 3-4H;  He knows that he must quit smoking- the Chantix + Nicoderm will likely cost him as much but we wrote the prescriptions as requested;  We plan rov recheck in 70mo8mooner if needed prn 12/26/16>  DaviShanon Browerstands the imperative to quit all smoking & he plans on hypnosis soon;  For now we will continue the Pred10Qam, Anoro once daily & prn Albuterol (reminded to use this more sparingly); his O2sat on RA w/ ambulation is improved & stays >90%-- we are planning an ONO as well... He needs his 2018 Flu vaccine & should get a pre-age64 Pneumovax 23 as well (?what he has received at PCPs office?)... Finally we wrote for Robaxin to try for his LBP & he will f/u w/ DrGolding.  03/28/17>   DaviShanon Browslowly improved but w/ serious COPD underlying & still smoking (had hypnosis yest), must quit all smoking, continue O2/ Anoro/ AlbutHFA; we discussed exercise program vs PulmRehab but $$ is an issue; f/u CT Chest due in  Feb... 08/22/17>   DaviShanon Cruz GOLD Stage 3-4 COPD w/ nocturnal hypoxemia and secondary pulmHTN on prev 2DEcho;  Clinically & functionally improved on meds & with smoking cessation;  Asked to continue the O2, Anoro, ProairHFA, and start the GFN400-2Tid w/ fluids;  He knows that he must remain off the cigarettes!   Plan:     Patient's Medications  New Prescriptions   No medications on file  Previous Medications   ALBUTEROL SULFATE (PROAIR RESPICLICK) 108 221 BASE) MCG/ACT AEPB    Inhale 1-2 puffs into the lungs every 6 (six) hours as needed.   METHOCARBAMOL (ROBAXIN) 500 MG TABLET    Take 1 tablet (500 mg total) by mouth 4 (four) times daily.   UMECLIDINIUM-VILANTEROL (ANORO ELLIPTA) 62.5-25 MCG/INH AEPB    Inhale 1 puff into the lungs daily.  Modified Medications   No medications on file  Discontinued Medications   No medications on file

## 2017-08-22 NOTE — Patient Instructions (Signed)
Today we updated your med list in our EPIC system...    Continue your current medications the same...  We discussed adding GUAIFENESIN 400mg  tabs OTC...    Take 2 tabs three times daily w/ fluids and work to expectorate the phlegm & clear your airways...  Congrats on not smoking-- this is critically important for your breathing going forward for the rest of your life!!!  Today we did a follow up spirometry breathing test and your FEV1 (amount you could blow out in the 1st second of the test) improved from 830 cc (24% of your predicted norm) in 2018 to 1020 cc today (29% of your predicted norm)-- this is a bout a 255 improvement in airflow & there is more improvement to be had!!!  Call for any questions...  Let's plan a follow up visit in 6mo, sooner if needed for problems.Marland Kitchen..Marland Kitchen

## 2017-12-25 ENCOUNTER — Ambulatory Visit: Payer: 59 | Admitting: Pulmonary Disease

## 2017-12-27 ENCOUNTER — Ambulatory Visit (INDEPENDENT_AMBULATORY_CARE_PROVIDER_SITE_OTHER): Payer: 59 | Admitting: Pulmonary Disease

## 2017-12-27 ENCOUNTER — Encounter: Payer: Self-pay | Admitting: Pulmonary Disease

## 2017-12-27 ENCOUNTER — Ambulatory Visit (INDEPENDENT_AMBULATORY_CARE_PROVIDER_SITE_OTHER): Payer: 59

## 2017-12-27 ENCOUNTER — Ambulatory Visit (INDEPENDENT_AMBULATORY_CARE_PROVIDER_SITE_OTHER)
Admission: RE | Admit: 2017-12-27 | Discharge: 2017-12-27 | Disposition: A | Discharge status: Home / Self Care | Payer: 59 | Source: Ambulatory Visit | Attending: Pulmonary Disease | Admitting: Pulmonary Disease

## 2017-12-27 VITALS — BP 130/82 | HR 75 | Temp 98.2°F | Ht 67.0 in | Wt 198.2 lb

## 2017-12-27 DIAGNOSIS — Z23 Encounter for immunization: Secondary | ICD-10-CM

## 2017-12-27 DIAGNOSIS — F17210 Nicotine dependence, cigarettes, uncomplicated: Secondary | ICD-10-CM | POA: Diagnosis not present

## 2017-12-27 DIAGNOSIS — R9389 Abnormal findings on diagnostic imaging of other specified body structures: Secondary | ICD-10-CM | POA: Diagnosis not present

## 2017-12-27 DIAGNOSIS — J449 Chronic obstructive pulmonary disease, unspecified: Secondary | ICD-10-CM | POA: Diagnosis not present

## 2017-12-27 DIAGNOSIS — G4734 Idiopathic sleep related nonobstructive alveolar hypoventilation: Secondary | ICD-10-CM | POA: Diagnosis not present

## 2017-12-27 MED ORDER — ALBUTEROL SULFATE 108 (90 BASE) MCG/ACT IN AEPB: 1.0000 | INHALATION_SPRAY | Freq: Four times a day (QID) | RESPIRATORY_TRACT | 11 refills | Status: DC | PRN

## 2017-12-27 MED ORDER — UMECLIDINIUM-VILANTEROL 62.5-25 MCG/INH IN AEPB: 1.0000 | INHALATION_SPRAY | Freq: Every day | RESPIRATORY_TRACT | 6 refills | Status: DC

## 2017-12-27 NOTE — Patient Instructions (Signed)
Today we updated your med list in our EPIC system...    Continue your current medications the same...  We refilled your meds per request...  Continue to work on smoking cessation!!! YOU CAN DO IT!!!  Today we did a follow up CXR... Please return to our lab one morning soon for your FASTING blood work...    We will contact you w/ the results when available...   Stay as active as possible (the exercise program is good for you)  Continue the Oxygen every night...  Call for any questions...  We will arrange for a follow up appt at our new office in about 4 months w/ Dr. Kathrin Penner...  Onalee Hua,  It has been my honor to have been one of your doctors over the last year or so...    My best wishes for health & happiness for you & your family in the years to come.Marland KitchenMarland Kitchen

## 2017-12-29 ENCOUNTER — Encounter: Payer: Self-pay | Admitting: Pulmonary Disease

## 2017-12-29 NOTE — Progress Notes (Signed)
Subjective:     Patient ID: Jeff Jenkins., male   DOB: 09-15-1965, 52 y.o.   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  ~  September 21, 2016:  Initial pulmonary consult by SN>  Jeff Cruz) is referred by his family for a pulmonary eval- he is 52 y/o & a current smoker of 1ppd w/ a 35+pack-yr hx;  He knows that he needs to quit & previously quit for awhile after hypnosis by ?DrOxley, Chantix was w/o benefit he says;  His CC is SOB/ DOE which he feels is slowly getting worse & noticed w/ recreation like water skiing, & also w/ yard work Social research officer, government;  He has a mild AM cough, small amt yellow sput, no hemoptysis, no CP, no hx cardiac issues, he's been treated by PCP & UC for bronchitis in past; he doesn't recall prev CXR; PCP is DrJGolding & current meds include Symbicort160-2spBid, AlbutHFA prn...  Smoking Hx>  Started age ~16, up to 1ppd max, for a 35 pack-yr smoking hx at age 70...   Pulmonary Hx>  Notes occas bronchitic infections, no hx pneumonia, no hx asthma, etc;  No hosp for resp problems,   Medical Hx>  Epic record indicates:  Borderline HBP, "asthma", obesity, referred to [facility] for colonoscopy per DrGolding...  Family Hx>  + for DM, heart dis, & chr lung dis in mother...  Occup Hx>  He is a Fish farm manager  Current Meds>  Symbicort160-2spBid, AlbutHFA prn... EXAM shows Afeb, VSS, O2sat=95% on RA at rest;  Wt=176# (he's lost 50# over the last few yrs on diet);  HEENT +thrush, ear wax, no adenopathy;  Chest- bilat congestion/ wheezing/ rhonchi;  Heart- RR w/o m/r/g detected;  Abd- soft nontender neg;  Ext- sl VI w/o c/c/e;  Neuro- intact;  Derm- "farmer's tan"...   CXR 09/21/16 (independently reviewed by me in the PACS system) showed norm heart size,  hyperexpanded lungs w/ vasc attenuation c/w COPD/ emphysema- NAD...   Spirometry 09/21/16>  FVC=1.87 (43%), FEV1=0.83 (24%), %1sec=44%, mid-flows reduced at 11% predicted;  This is c/w severe airflow obstruction and GOLD Stage 4 COPD...  Ambulatory oximetry 09/21/16 on RA>  O2sat=94% on RA at rest w/ heart rate=70/min;  He ambulated 3 Laps in office (185'ea) w/ lowest O2sat=86% w/ pulse 101/min...  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;  He must quit all smoking- Chantix w/o help in past but hypnosis helped therefore "go for it" as the end justifies the means;  We discussed changing the Symbicort to [facility] one  inhalation daily & continue the AlbutHFA rescue inhaler as needed (we reviewed rinsing technique after the rx);  Add MUCINEX 6379m 2Bid w/ fluids;  For his thrush we wrote for DIFLUCAN 1014mx7d and rec an OTC Probiotic as well;  Finally we decided  To Rx w/ a PREDNISONE taper- see AVS;  He will ret for f/u 79m58mo   ~  October 25, 2016:  79mo24mo & Jeff Cruz reports a great response to the Pred taper, plus Anoro & Mucinex;  He notes however that when the Pred ran out 1 week ago that his breathing deteriorated w/ some incr in cough, congestion, and SOB/DOE which required him to start using the rescue inhaler every 3-4H;  Unfortunately he is still smoking 1ppd &  has been unable to quit on his own- he checked w/ ?DrOxley but the cost is $400 & he is requesting a Rx for Chantix + rec for nicotine replacement therapy w/ patches, gum, or lozenges... We discussed the need for further evaluation w/ FullPFTs and a CT Chest...    EXAM shows Afeb, VSS, O2sat=95% on RA at rest;  Wt=181# (up 6#);  HEENT thrush resolved, +ear wax, no adenopathy;  Chest- bilat congestion/ sl wheezing/ +rhonchi;  Heart- RR w/o m/r/g detected;  Abd- soft nontender neg;  Ext- sl VI w/o c/c/e;  Neuro- intact;  Derm- "farmer's tan"...   Hi-res CT CHEST 11/03/16 showed norm heart size, dilated main pulm art at 3.5cm; no adenopathy, mild centrilob & paraseptal emphysema w/ diffuse bronchial wall thickening, scat tiny RUL granuloma and a 1cm subsolidRLL pulm nodule, granulomatous splenic calcif, mod thoracic spondylosis => we will proceed w/ 2DEcho to check for pulm HTN...  2DEcho - pending IMP/PLAN>>  We decided to hold on the FullPFTs for now & will order the Hi-res CT Chest for further evaluation (CT shows no signs of ILD, mixed chr bronchitic & emphysema pathology, incidental 1cm RLL nodule; Spirometry showed that his COPD is severe w/ FEV1=0.83 (24%);  We need to place him back on the PREDNISONE (lower dose, ie-20 then10) and wean it more slowly as his response was very good by his description & he did not use the rescue inhaler while on the Pred, but after he weaned off he found that he needed the AlbutHFA every 3-4H;  He knows that he must quit smoking- the Chantix + Nicoderm will likely cost him as much but we wrote the prescriptions as requested;  His 2DEcho is pending- we plan rov recheck in 442mo,23moner if needed prn... NOTE:  >50% of this 25min28m was spent in counseling & coordination of care...  ADDENDUM>>  2DEcho 11/08/16 showed norm LV w/ vigorous LVF & EF=65-70% w/ norm wall motion & no regional wall motion abn, Gr1DD, AoV poorly vis w/ at least mild AS (mean grad=12mmHg47mV w/ triv MR,  RV size & function normal, PAsys=45mmHg.38mwe plan rov recheck, assess need for Home O2 at this point, monitor AS & PA press going forward...  ~  December 26, 2016:  442mo ROV 879mopulmonary follow up visit> Jeff Cruz staShanon Browis breathing is good- stable on Rx w/ Pred10mgQam, 579mo one inhalation daily, & AlbutHFA rescue inhaler as needed (averaging 3-4x per day);  He is still smoking ~5cig/d on ave & he did not try Chantix, chose the nicotine patches and has arranged for a hypnotist session coming up soon (he quit w/ hypnosis in the past)... He admits to a mild cough, small amt greenish sput in the  AMs, no hemoptysis; he says is SOB/DOE is better & noted mostly w/ lifting objects 7 carrying the weight; states ADLs, walking, stairs, yard are all ok (he's stoic); he denies CP/ palpit/ edema & his CC is LBP=> rec rest, heat, OTC pain meds + Robaxin 5449m tid...  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    Cig smoker, <1/2ppd currently w/ 35 pack-yr smoking hx> He must quit smoking completely- he is going to re-try hypnosis, on nicotine replacement, declined chantix...    Hx exertional hypoxemia> aware, this has improved w/ inhalers and with less smoking; Ambulatory oximetry today 12/26/16 w/ lowest O2sat=91% after 3 laps on RA... ONO is pending...     Cardiac issues>  2Decho 8/18 showed norm LVF & wall motion, Gr1DD, AoV poorly vis w/ at least mild AS, PAsys~30mm72m.     Medical issues>  His PCP is DrJGolding & we do not have notes from him- Hx thrush, borderline BP, overweight, LBP...    EXAM shows Afeb, VSS, O2sat=90% on RA at rest;  Wt=187# (up 6#);  HEENT thrush resolved, +ear wax, no adenopathy;  Chest- bilat congestion/ sl wheezing/ +rhonchi;  Heart- RR w/o m/r/g detected;  Abd- soft nontender neg;  Ext- sl VI w/o c/c/e;  Neuro- intact;  Derm- "farmer's tan"...   Ambulatory oximetry 12/26/16>  O2sat=97% on RA at rest w/ HR=73/min;  He ambulated in the office on RA a total of 3 laps (185'ea) w/ lowest O2sat=91% w/ HR=86/min (improved from 7/18)...   Overnight Oximetry>  Done 01/09/17 => O2sat nadir= 81% & he spent 2h 18min44ms than 88% which qualifies him for O2 Qhs at 2L/min flow...  IMP/PLAN>>  Jeff Cruz Jeff Browstands the imperative to quit all smoking & he plans on hypnosis soon;  For now we will continue the Pred10Qam, Anoro once daily & prn Albuterol (reminded to use this more sparingly); his O2sat on RA w/ ambulation is improved & stays >90%-- we are planning an ONO as well... He needs his 2018 Flu vaccine & should get a pre-age64 Pneumovax 23 as well (?what he has received at PCPs office?)... Finally we wrote for Robaxin to try for his LBP & he will f/u w/ DrGolding...  ~  March 28, 2017:  76mo RO38moDavid says he switched his insurance from [employer] ($[place] [employer]) to [employer] ([place]) but now he has an issue w/ them covering his nocturnal O2 (?$150/mo) & we plan another ONO to get him qualified;  Jeff Cruz sShanon Browe had hypnosis yest & hasn't had a cig since=> knows he needs to quit & stay quit to help prolong his life!  He notes breathing is good & he's been off Pred for several wks now, on Anoro one daily, AlbutHFA prn but using it 3-4 x daily & asked to cut back;  Notes mild cough, sm  amt clear sput in AM, no hemoptysis, similar DOE- mostly w/ lifting &  carrying weight;  ADL's are ok & he denies CP/ palpit/ edema;  His CC remains LBP on Robaxin prn...  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> off Pred now, on ANORO one inhalation daily, AlbutHFA prn (still using 3-4xdaily);  Spirometry 7/18 w/ FEV1=0.83 (24%), and exerc induced hypoxemia w/ O2sat on RA=86% after 3laps (pulse=101/min);  Hi-res CT Chest 8/18 showed dilated main PA at 3.5cm, +centrilob & paraseptal emphysema + diffuse bronch wall thickening, RUL gran/ RLL 1cm nodule=> this nodule needs f/u noncontrast CT Chest in 59mo   RLL pulm nodule on CT Chest> 1cm subsolid RLL pulm nodule noted 8/18 CT Chest=> f/u 05/2017 w/ resolution of this nodule, +centrilob emphysema & bronchial wall thickening bilat w/ areas of small airway impaction in LLs, tiny subpleural nodules in RUL (calcif granulomas) & RLL are stable, +enlarged PAs as before...    Pulmonary HTN by 2DEcho w/ PAsys est~458mg on 10/2016- he is on nocturnal O2 at 2L/min & prn days...    Cig smoker, <1/2ppd currently w/ 35 pack-yr smoking hx> He must quit smoking completely- he is going to re-try hypnosis, on nicotine replacement, declined chantix...    Hx nocturnal & exertional hypoxemia> aware, this has improved w/ inhalers and with less smoking; Ambulatory oximetry 10/18 w/ lowest O2sat=91% after 3 laps on RA... ONO 10/18 w/ nadir O2sat=81% & 2H <88% qualifying him for O2; repeat ONO 1/19 showed nadir O2sat=84% & 23 min <88% therefore still qualifies for his nocturnal O2 (+the pulmHTN)...    Cardiac issues>  2Decho 8/18 showed norm LVF & wall motion, Gr1DD, AoV poorly vis w/ at least mild AS, PAsys~4542m...     Medical issues>  His PCP is DrJGolding & we do not have notes from him- Hx thrush, borderline BP, overweight, LBP...    EXAM shows Afeb, VSS, O2sat=93% on RA at rest;  Wt=187# (up 6#);  HEENT thrush  resolved, +ear wax, no adenopathy;  Chest- bilat congestion/ sl wheezing/ +rhonchi;  Heart- RR w/o m/r/g detected;  Abd- soft nontender neg;  Ext- sl VI w/o c/c/e;  Neuro- intact;  Derm- "farmer's tan"...   We discussed repeating Spirometry but he wants to wait due to $$ issues...  Overnight Oximetry done 04/04/17>  8.5H study showed O2sat <88% for 23 min, with nadir O2sat=84%;  He qualifies for nocturnal O2 under Group 1 criteria- and we will set up nocturnal O2 at 2L/min [place]...  IMP/PLAN>>  DavShanon Cruz slowly improved but w/ serious COPD underlying & still smoking (had hypnosis yest), must quit all smoking, continue O2/ Anoro/ AlbutHFA; we discussed exercise program vs PulmRehab but $$ is an issue; f/u CT Chest due in Feb...   ADDENDUM>>  CT Chest done 06/01/17>> norm heart size, prom pulm arts- query PAH, scat small mediastinal LNs- no change, centrilob emphysema & bronchial wall thickening bilat w/ areas of small airway impaction in LLs, tiny subpleural nodules in RUL (calcif granulomas) & RLL are stable & prev ill-defined 61m58mdule in medial RUL is gone!  ~  August 22, 2017:  3mo 27mo& Jeff Cruz reports that he quit smoking ~28mo a87mo/ hypnosis and nicoderm patches;  He feels that his breathing is a lot better "I have better wind" he says, less SOB, less DOE w/ lifting & carrying;  Notes  mild cough, min clear sput, no hemoptysis and he denies CP/ palpit/ dizziness, edema... He remains on O2 at 2L/min Qhs, ANORO one puff daily, PROAIR-HFA prn but still using 3-4 times daily, & I note that he is NOT using any GFN- asked to restart GFN (Mucus Relief) 459m- 2Tid w/ fluids;  He is off PRED but notes that he feels he did sl better ON this med... 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> off Pred now, on ANORO one inhalation daily, AlbutHFA prn (still using 3-4xdaily);  Spirometry 7/18 w/  FEV1=0.83 (24%), and exerc induced hypoxemia w/ O2sat on RA=86% after 3laps (pulse=101/min);  Hi-res CT Chest 8/18 showed dilated main PA at 3.5cm, +centrilob & paraseptal emphysema + diffuse bronch wall thickening, RUL gran/ RLL 1cm nodule=> this nodule needs f/u noncontrast CT Chest in 640mo  RLL pulm nodule on CT Chest> 1cm subsolid RLL pulm nodule noted 8/18 CT Chest=> f/u 05/2017 w/ resolution of this nodule, +centrilob emphysema & bronchial wall thickening bilat w/ areas of small airway impaction in LLs, tiny subpleural nodules in RUL (calcif granulomas) & RLL are stable, +enlarged PAs as before...    Pulmonary HTN by 2DEcho w/ PAsys est~4535m on 10/2016- he is on nocturnal O2 at 2L/min & prn days...    Ex-Cig smoker, w/ 35 pack-yr smoking hx, quit 06/2017 after hypnosis & nicoderm> He knows he must quit smoking completely & stay quit...     Hx nocturnal & exertional hypoxemia> aware, this has improved w/ inhalers and with less smoking; Ambulatory oximetry 10/18 w/ lowest O2sat=91% after 3 laps on RA... ONO 10/18 w/ nadir O2sat=81% & 2H <88% qualifying him for O2; repeat ONO 1/19 showed nadir O2sat=84% & 23 min <88% therefore still qualifies for his nocturnal O2 (+the pulmHTN)...    Cardiac issues>  2Decho 8/18 showed norm LVF & wall motion, Gr1DD, AoV poorly vis w/ at least mild AS, PAsys~2m79m..     Medical issues>  His PCP is DrJGolding & we do not have notes from him- Hx thrush, borderline BP, overweight, LBP...    EXAM shows Afeb, VSS, O2sat=96% on RA at rest;  Wt=193# (up 6#);  HEENT thrush resolved, +ear wax, no adenopathy;  Chest- bilat congestion/ sl wheezing/ +rhonchi;  Heart- RR w/o m/r/g detected;  Abd- soft nontender neg;  Ext- sl VI w/o c/c/e;  Neuro- intact;  Derm- "farmer's tan" w/o lesions...   Spirometry 08/22/17>  FVC=1.95 (43%), FEV1=1.02 (29%), %1sec=53%, mid-flows reduced at 17% predicted; this is sl improved from 09/2016 PFT where FEV1=0.83... IMP/PLAN>>  DaviShanon Cruz GOLD Stage 3-4  COPD w/ nocturnal hypoxemia and secondary pulmHTN on prev 2DEcho;  Clinically & functionally improved on meds & with smoking cessation;  Asked to continue the O2, Anoro, ProairHFA, and start the GFN400-2Tid w/ fluids;  He knows that he must remain off the cigarettes!   ~  December 27, 2017:  33mo 29mo& pulmonary follow up visit>         Past Medical History:  Diagnosis Date  . Asthma     History reviewed. No pertinent surgical history.   Outpatient Encounter Medications as of 12/27/2017  Medication Sig  . Albuterol Sulfate (PROAIR RESPICLICK) 108 (384Base) MCG/ACT AEPB Inhale 1-2 puffs into the lungs every 6 (six) hours as needed.  . umeclidinium-vilanterol (ANORO ELLIPTA) 62.5-25 MCG/INH AEPB Inhale 1 puff into the  lungs daily.  . [DISCONTINUED] Albuterol Sulfate (PROAIR RESPICLICK) 287 (90 Base) MCG/ACT AEPB Inhale 1-2 puffs into the lungs every 6 (six) hours as needed.  . [DISCONTINUED] methocarbamol (ROBAXIN) 500 MG tablet Take 1 tablet (500 mg total) by mouth 4 (four) times daily.  . [DISCONTINUED] umeclidinium-vilanterol (ANORO ELLIPTA) 62.5-25 MCG/INH AEPB Inhale 1 puff into the lungs daily.   No facility-administered encounter medications on file as of 12/27/2017.     No Known Allergies   Immunization History  Administered Date(s) Administered  . Influenza,inj,Quad PF,6+ Mos 12/28/2015, 12/26/2016, 12/27/2017    Current Medications, Allergies, Past Medical History, Past Surgical History, Family History, and Social History were reviewed in [employer] record.   Review of Systems          All symptoms NEG except where BOLDED >>  Constitutional:  F/C/S, fatigue, anorexia, unexpected weight change. HEENT:  HA, visual changes, hearing loss, earache, nasal symptoms, sore throat, mouth sores, hoarseness. Resp:  cough, sputum, hemoptysis; SOB, tightness, wheezing. Cardio:  CP, palpit, DOE, orthopnea, edema. GI:  N/V/D/C, blood in stool; reflux, abd  pain, distention, gas. GU:  dysuria, freq, urgency, hematuria, flank pain, voiding difficulty. MS:  joint pain, swelling, tenderness, decr ROM; neck pain, back pain, etc. Neuro:  HA, tremors, seizures, dizziness, syncope, weakness, numbness, gait abn. 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 Psyche:  confusion, agitation, sleep disturbance, hallucinations, anxiety, depression suicidal.   Objective:   Physical Exam    Vital Signs:  Reviewed...   General:  WD, WN, 52 y/o WM in NAD; alert & oriented; pleasant & cooperative... HEENT:  [place]/AT; Conjunctiva- pink, Sclera- nonicteric, EOM-wnl, PERRLA; EACs-wax; NOSE-clear; THROAT- +thrush Neck:  Supple w/ fair ROM; no JVD; normal carotid impulses w/o bruits; no thyromegaly or nodules palpated; no lymphadenopathy.  Chest:  Slight decr BS at bases, bilat congested lungs w/ scat rhonchi & exp wheezing, no consolidation... Heart:  Regular Rhythm; norm S1 & S2 without murmurs, rubs, or gallops detected. Abdomen:  Soft & nontender- no guarding or rebound; normal bowel sounds; no organomegaly or masses palpated. Ext:  Normal ROM; without deformities or arthritic changes; no varicose veins, venous insuffic, or edema;  Pulses intact w/o bruits. Neuro:  CNs II-XII intact; motor testing normal; sensory testing normal; gait normal & balance OK, +LBP. Derm:  He is way too tanned- no lesions noted; no rash etc. Lymph:  No cervical, supraclavicular, axillary, or inguinal adenopathy palpated.   Assessment:      IMP >>    Severe airflow obstruction c/w GOLD Stage 3-4 COPD, due to mixed chr bronchitis & emphysema    CT Chest w/o signs of ILD but pos for mixed chr bronchitic & emphysematous changes, plus a 1cm subsolid RLL nodule=> f/u scan [id number] showed nodule resolved...    CT Chest also showed dilated main pulm art at 3.5cm-- r/o pulmHTN, screening 2DEcho w/ PAsys~55mHg=> he  remains on O2 at 2L/min Qhs...    Cig smoker, <1/2ppd currently w/ 35 pack-yr smoking hx=> he quit smoking 06/2017 after hypnosis & nicoderm patches...    Exertional & Nocturnal hypoxemia=> he remains on nocturnal O2 but his ambulatory oximetry improved w/ treatment...    Cardiac issues>  At least mild AS on 2DEcho    Medical issues>  Trush resolved, LBP (rx per PCP)...  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;  He must quit all smoking- Chantix w/o help in past but hypnosis helped therefore "go for it" as the end justifies the means;  We discussed changing the Symbicort to [facility] one inhalation daily & continue the albutHFA rescue inhaler as needed (we reviewed rinsing technique after the rx);  Add MUCINEX 6834m 2Bid w/ fluids;  For his thrush we wrote for DIFLUCAN 1070mx7d and rec an OTC Probiotic as well;  Finally we decided  To Rx w/ a PREDNISONE taper- see AVS;  He will ret for f/u 34m85mo  10/25/16>   We decided to hold on the FullPFTs for now & will order the Hi-res CT Chest for further evaluation;  We need to place him back on the PREDNISONE (lower dose, ie-20 then10) and wean it more slowly as his response was very good by his description & he did not use the rescue inhaler while on the Pred, but after he weaned off he found that he needed the AlbutHFA every 3-4H;  He knows that he must quit smoking- the Chantix + Nicoderm will likely cost him as much but we wrote the prescriptions as requested;  We plan rov recheck in 10mo34mooner if needed prn 12/26/16>  DaviShanon Browerstands the imperative to quit all smoking & he plans on hypnosis soon;  For now we will continue the Pred10Qam, Anoro once daily & prn Albuterol (reminded to use this more sparingly); his O2sat on RA w/ ambulation is improved & stays >90%-- we are planning an ONO as well... He needs his 2018 Flu vaccine & should get a pre-age64 Pneumovax 23 as  well (?what he has received at PCPs office?)... Finally we wrote for Robaxin to try for his LBP & he will f/u w/ DrGolding.  03/28/17>   DaviShanon Browslowly improved but w/ serious COPD underlying & still smoking (had hypnosis yest), must quit all smoking, continue O2/ Anoro/ AlbutHFA; we discussed exercise program vs PulmRehab but $$ is an issue; f/u CT Chest due in Feb... 08/22/17>   DaviShanon Cruz GOLD Stage 3-4 COPD w/ nocturnal hypoxemia and secondary pulmHTN on prev 2DEcho;  Clinically & functionally improved on meds & with smoking cessation;  Asked to continue the O2, Anoro, ProairHFA, and start the GFN400-2Tid w/ fluids;  He knows that he must remain off the cigarettes!   Plan:     Patient's Medications  New Prescriptions   No medications on file  Previous Medications   No medications on file  Modified Medications   Modified Medication Previous Medication   ALBUTEROL SULFATE (PROAIR RESPICLICK) 108 790 BASE) MCG/ACT AEPB Albuterol Sulfate (PROAIR RESPICLICK) 108 383 Base) MCG/ACT AEPB      Inhale 1-2 puffs into the lungs every 6 (six) hours as needed.    Inhale 1-2 puffs into the lungs every 6 (six) hours as needed.   UMECLIDINIUM-VILANTEROL (ANORO ELLIPTA) 62.5-25 MCG/INH AEPB umeclidinium-vilanterol (ANORO ELLIPTA) 62.5-25 MCG/INH AEPB      Inhale 1 puff into the lungs daily.    Inhale 1 puff into the lungs daily.  Discontinued Medications   METHOCARBAMOL (ROBAXIN) 500 MG TABLET    Take 1 tablet (500 mg total) by mouth 4 (four) times daily.

## 2018-06-28 ENCOUNTER — Encounter: Payer: Self-pay | Admitting: Gastroenterology

## 2018-07-02 NOTE — Telephone Encounter (Signed)
error 

## 2018-08-21 ENCOUNTER — Other Ambulatory Visit: Payer: Self-pay | Admitting: Pulmonary Disease

## 2018-09-22 ENCOUNTER — Other Ambulatory Visit: Payer: Self-pay | Admitting: Pulmonary Disease

## 2018-09-24 ENCOUNTER — Ambulatory Visit: Payer: 59 | Admitting: Gastroenterology

## 2018-10-08 ENCOUNTER — Other Ambulatory Visit: Payer: Self-pay | Admitting: Pulmonary Disease

## 2018-10-10 IMAGING — CT CT CHEST HIGH RESOLUTION W/O CM
2 of 6 series · 14 of 36 positions shown, 17 images · non-contrast
Comparison: 09/21/2016 chest radiograph.

CLINICAL DATA: Chronic wheezing and dyspnea. Abnormal breath sounds
on exam. Current smoker.

EXAM:
CT CHEST WITHOUT CONTRAST
TECHNIQUE: Multidetector CT imaging of the chest was performed following the
standard protocol without intravenous contrast. High resolution
imaging of the lungs, as well as inspiratory and expiratory imaging,
was performed.

[Series 3: thorax · axial · 0.68mm/px · z∈[+1448,+1700]mm · 11 of 141 slices shown, 14 images]
[im 8/141  mediastinal]
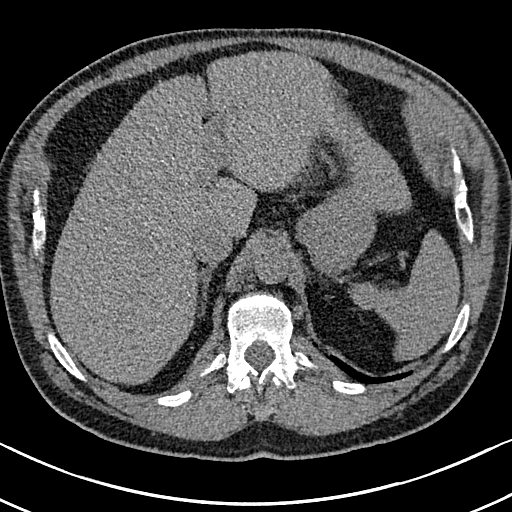
[im 8/141  lung]
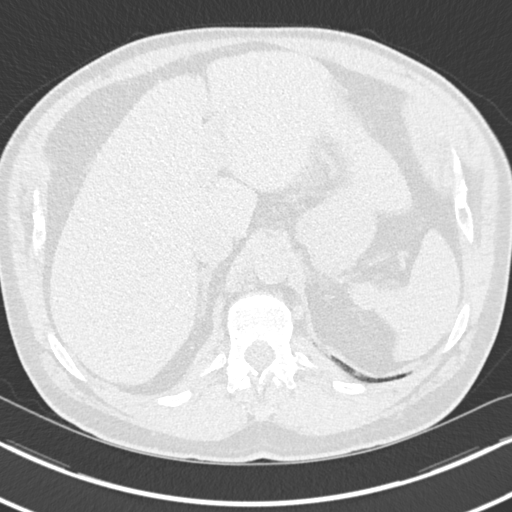
[im 22/141  lung]
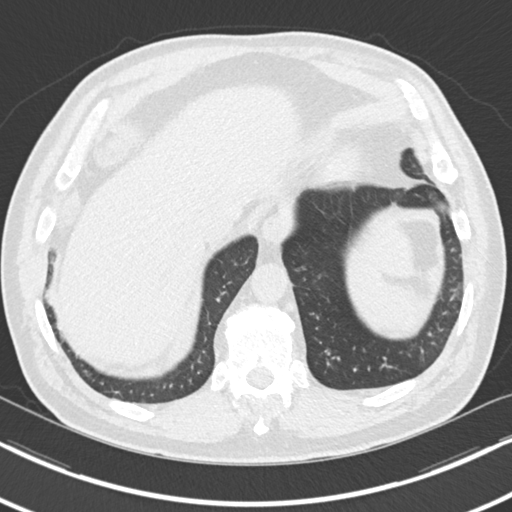
[im 36/141  lung]
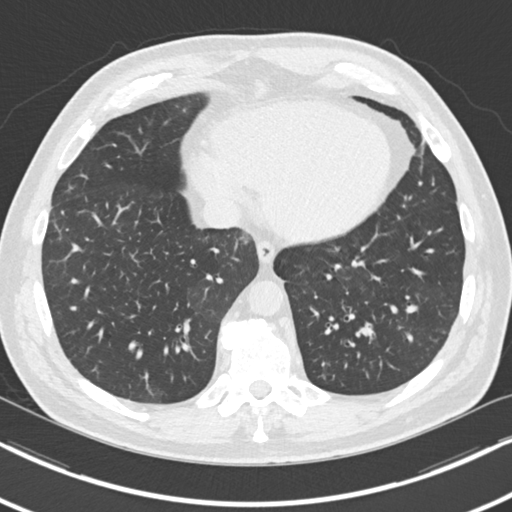
[im 50/141  lung]
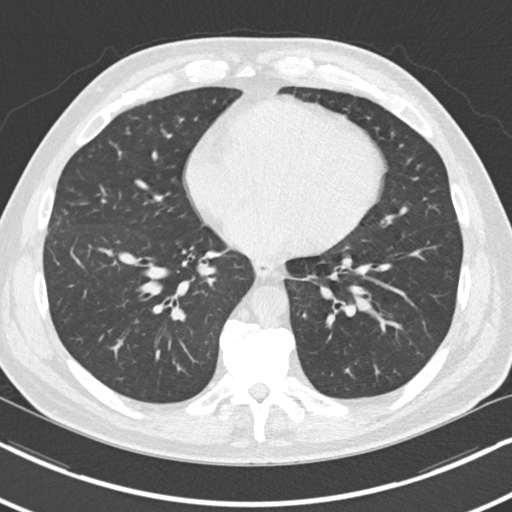
[im 57/141  mediastinal]
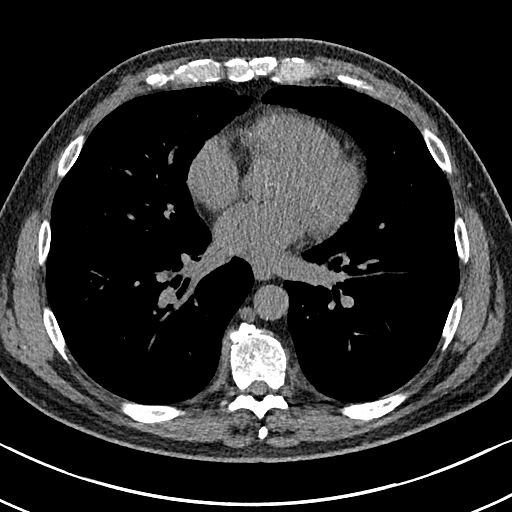
[im 57/141  lung]
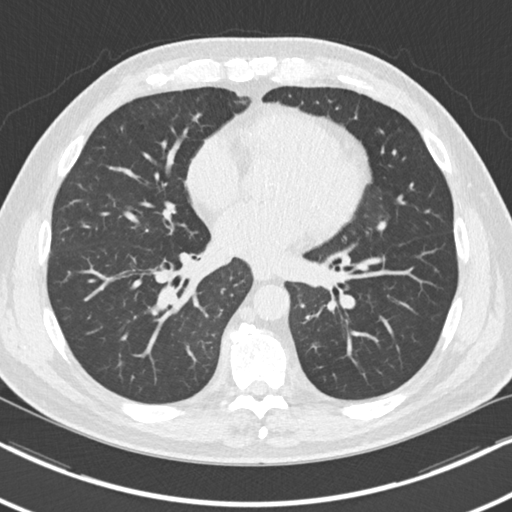
[im 71/141  lung]
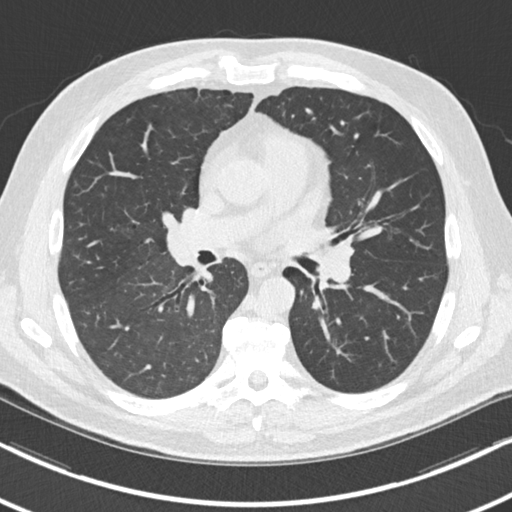
[im 85/141  lung]
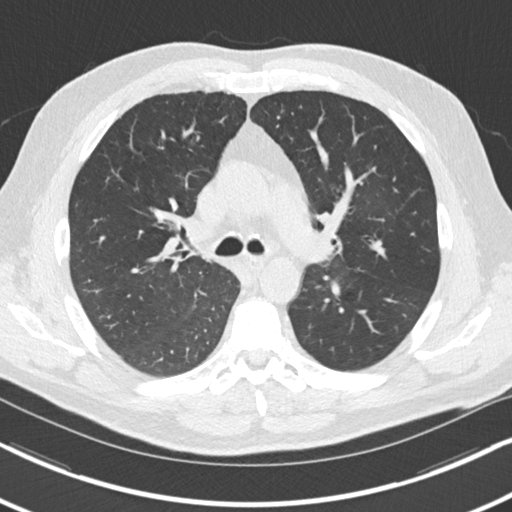
[im 92/141  lung]
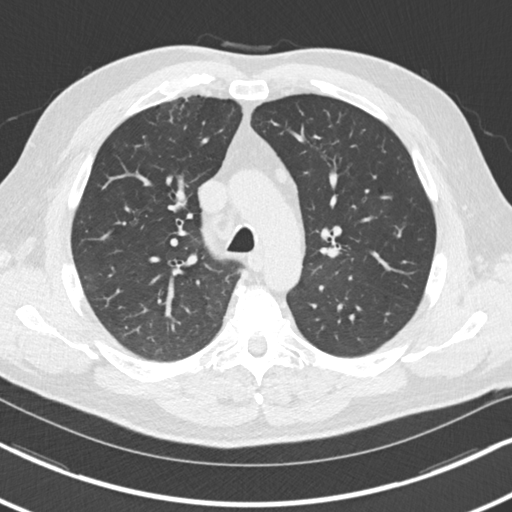
[im 106/141  mediastinal]
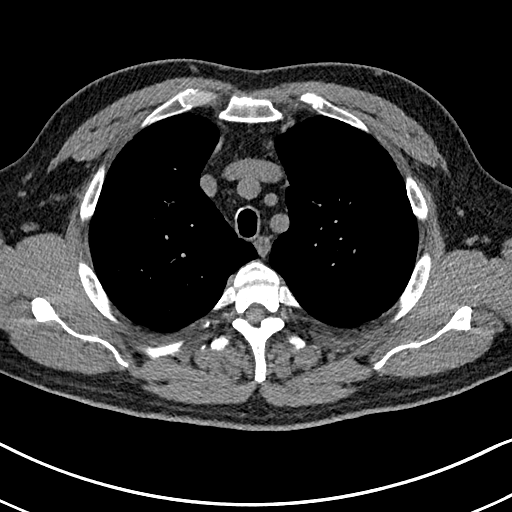
[im 106/141  lung]
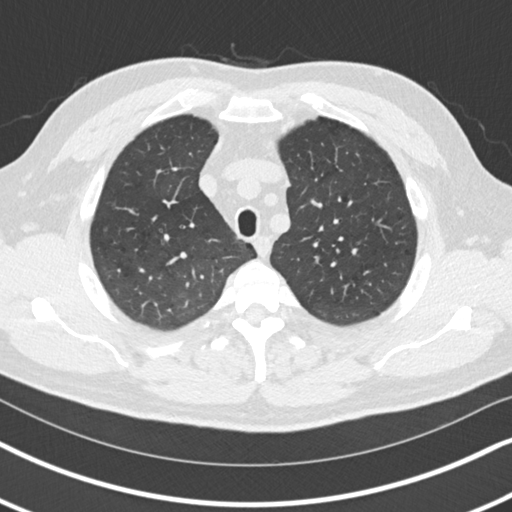
[im 120/141  lung]
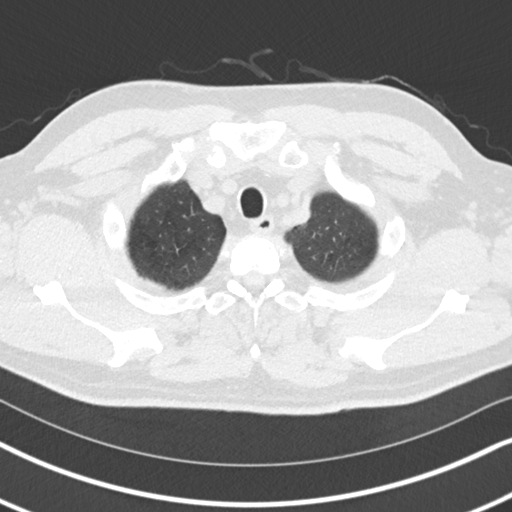
[im 134/141  lung]
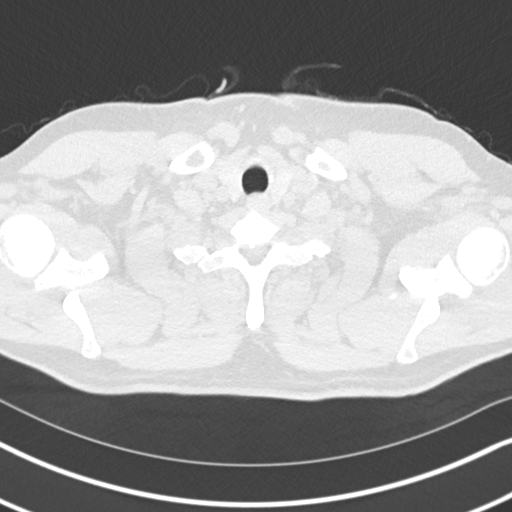

[Series 10: coronal · coronal · 0.58mm/px · 3 of 101 slices shown]
[im 21/101  lung]
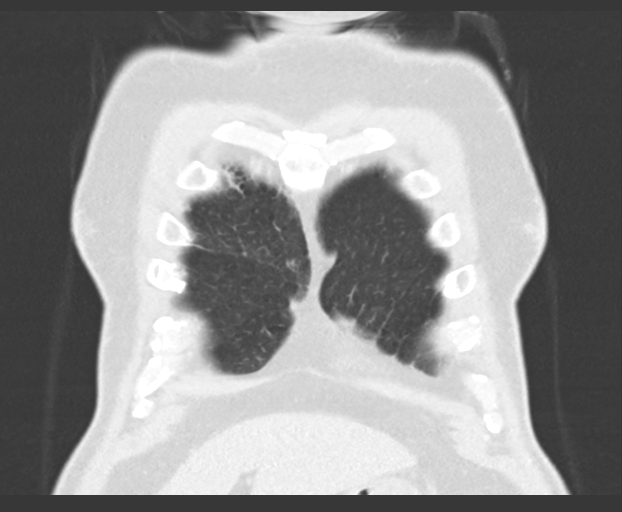
[im 41/101  lung]
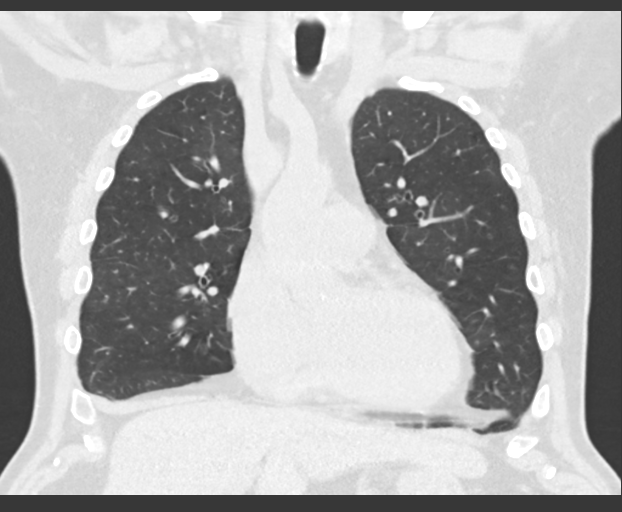
[im 61/101  lung]
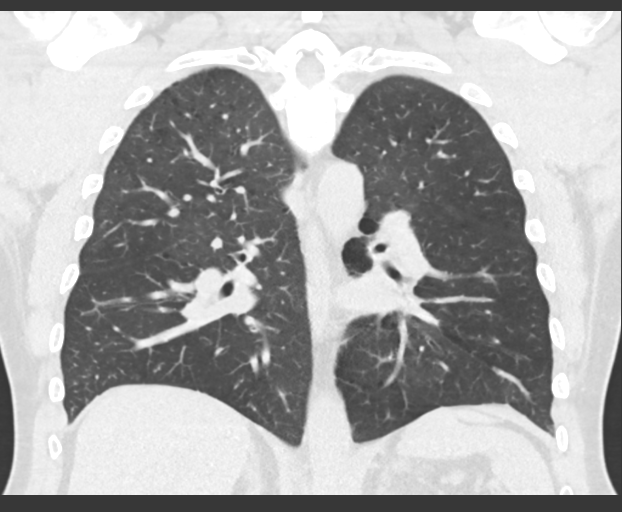

[14 of 36 positions shown; findings below may reference images not displayed]

FINDINGS: Cardiovascular: Normal heart size. No significant pericardial
fluid/thickening. Normal course and caliber of the thoracic aorta.
Dilated main pulmonary artery (3.5 cm diameter).

Mediastinum/Nodes: No discrete thyroid nodules. Unremarkable
esophagus. No pathologically enlarged axillary, mediastinal or gross
hilar lymph nodes, noting limited sensitivity for the detection of
hilar adenopathy on this noncontrast study.

Lungs/Pleura: No pneumothorax. No pleural effusion. Mild
centrilobular and paraseptal emphysema with diffuse bronchial wall
thickening. Scattered tiny calcified right upper lobe granulomas.
Subsolid 1.0 cm medial right lower lobe pulmonary nodule (series 8/
image 108). No acute consolidative airspace disease, lung masses or
additional significant pulmonary nodules. No significant lobular air
trapping on the limited expiration sequence. No significant regions
of subpleural reticulation, ground-glass attenuation, traction
bronchiectasis, parenchymal banding, architectural distortion or
frank honeycombing.

Upper abdomen: Granulomatous splenic calcification.

Musculoskeletal: No aggressive appearing focal osseous lesions.
Moderate thoracic spondylosis.
IMPRESSION: 1. No evidence of interstitial lung disease.
2. Subsolid 1.0 cm medial right lower lobe pulmonary nodule.
Follow-up non-contrast CT recommended at 3-6 months to confirm
persistence. If unchanged, and solid component remains <6 mm, annual
CT is recommended until 5 years of stability has been established.
If persistent these nodules should be considered highly suspicious
if the solid component of the nodule is 6 mm or greater in size and
enlarging. This recommendation follows the consensus statement:
Guidelines for Management of Incidental Pulmonary Nodules Detected
[DATE].
3. Mild centrilobular and paraseptal emphysema with diffuse
bronchial wall thickening, suggesting COPD.
4. Dilated main pulmonary artery, suggesting pulmonary arterial
hypertension.

Emphysema ([id number]).

## 2018-10-29 ENCOUNTER — Encounter: Payer: Self-pay | Admitting: Gastroenterology

## 2018-10-29 NOTE — Progress Notes (Deleted)
Referring Provider: Dr. Hilma Favors Primary Care Physician:  Sharilyn Sites, MD Primary Gastroenterologist:  Dr. Rayne Du chief complaint on file.   HPI:   Jeff Cruz. is a 53 y.o. male presenting today at the request of Dr. Hilma Favors to schedule colonoscopy. Past medical history significant for asthma, COPD with nocturnal hypoxemia and secondary pulmonary hypertension.    Today    Past Medical History:  Diagnosis Date  . Asthma     No past surgical history on file.  Current Outpatient Medications  Medication Sig Dispense Refill  . Albuterol Sulfate (PROAIR RESPICLICK) 258 (90 Base) MCG/ACT AEPB Inhale 1-2 puffs into the lungs every 6 (six) hours as needed. 1 each 11  . ANORO ELLIPTA 62.5-25 MCG/INH AEPB INHALE 1 PUFF INTO THE LUNGS DAILY. 60 each 1   No current facility-administered medications for this visit.     Allergies as of 10/30/2018  . (No Known Allergies)    Family History  Problem Relation Age of Onset  . Diabetes Mother   . Heart disease Father   . Heart disease Maternal Grandmother   . Heart disease Maternal Grandfather   . Heart disease Paternal Grandmother   . Heart disease Paternal Grandfather     Social History   Socioeconomic History  . Marital status: Divorced    Spouse name: Not on file  . Number of children: Not on file  . Years of education: Not on file  . Highest education level: Not on file  Occupational History  . Not on file  Social Needs  . Financial resource strain: Not on file  . Food insecurity    Worry: Not on file    Inability: Not on file  . Transportation needs    Medical: Not on file    Non-medical: Not on file  Tobacco Use  . Smoking status: Former Smoker    Packs/day: 1.00    Years: 35.00    Pack years: 35.00    Types: Cigarettes    Quit date: 06/12/2017    Years since quitting: 1.3  . Smokeless tobacco: Never Used  Substance and Sexual Activity  . Alcohol use: Yes    Comment: social  . Drug use: No  .  Sexual activity: Not on file  Lifestyle  . Physical activity    Days per week: Not on file    Minutes per session: Not on file  . Stress: Not on file  Relationships  . Social Herbalist on phone: Not on file    Gets together: Not on file    Attends religious service: Not on file    Active member of club or organization: Not on file    Attends meetings of clubs or organizations: Not on file    Relationship status: Not on file  . Intimate partner violence    Fear of current or ex partner: Not on file    Emotionally abused: Not on file    Physically abused: Not on file    Forced sexual activity: Not on file  Other Topics Concern  . Not on file  Social History Narrative  . Not on file    Review of Systems: Gen: Denies any fever, chills, fatigue, weight loss, lack of appetite.  CV: Denies chest pain, heart palpitations, peripheral edema, syncope.  Resp: Denies shortness of breath at rest or with exertion. Denies wheezing or cough.  GI: Denies dysphagia or odynophagia. Denies jaundice, hematemesis, fecal incontinence. GU : Denies  urinary burning, urinary frequency, urinary hesitancy MS: Denies joint pain, muscle weakness, cramps, or limitation of movement.  Derm: Denies rash, itching, dry skin Psych: Denies depression, anxiety, memory loss, and confusion Heme: Denies bruising, bleeding, and enlarged lymph nodes.  Physical Exam: There were no vitals taken for this visit. General:   Alert and oriented. Pleasant and cooperative. Well-nourished and well-developed.  Head:  Normocephalic and atraumatic. Eyes:  Without icterus, sclera clear and conjunctiva pink.  Ears:  Normal auditory acuity. Nose:  No deformity, discharge,  or lesions. Mouth:  No deformity or lesions, oral mucosa pink.  Neck:  Supple, without mass or thyromegaly. Lungs:  Clear to auscultation bilaterally. No wheezes, rales, or rhonchi. No distress.  Heart:  S1, S2 present without murmurs appreciated.   Abdomen:  +BS, soft, non-tender and non-distended. No HSM noted. No guarding or rebound. No masses appreciated.  Rectal:  Deferred  Msk:  Symmetrical without gross deformities. Normal posture. Pulses:  Normal pulses noted. Extremities:  Without clubbing or edema. Neurologic:  Alert and  oriented x4;  grossly normal neurologically. Skin:  Intact without significant lesions or rashes. Cervical Nodes:  No significant cervical adenopathy. Psych:  Alert and cooperative. Normal mood and affect.

## 2018-10-30 ENCOUNTER — Encounter: Payer: Self-pay | Admitting: Gastroenterology

## 2018-10-30 ENCOUNTER — Ambulatory Visit: Payer: 59 | Admitting: Gastroenterology

## 2018-10-30 ENCOUNTER — Telehealth: Payer: Self-pay | Admitting: Gastroenterology

## 2018-10-30 NOTE — Telephone Encounter (Signed)
PATIENT WAS A NO SHOW AND LETTER SENT  °

## 2018-11-09 ENCOUNTER — Other Ambulatory Visit: Payer: Self-pay | Admitting: Pulmonary Disease

## 2019-10-22 DIAGNOSIS — M545 Low back pain, unspecified: Secondary | ICD-10-CM | POA: Insufficient documentation

## 2019-11-26 ENCOUNTER — Ambulatory Visit (INDEPENDENT_AMBULATORY_CARE_PROVIDER_SITE_OTHER): Payer: 59 | Admitting: Pulmonary Disease

## 2019-11-26 ENCOUNTER — Encounter: Payer: Self-pay | Admitting: Pulmonary Disease

## 2019-11-26 ENCOUNTER — Other Ambulatory Visit: Payer: Self-pay

## 2019-11-26 ENCOUNTER — Ambulatory Visit (INDEPENDENT_AMBULATORY_CARE_PROVIDER_SITE_OTHER): Payer: 59

## 2019-11-26 VITALS — BP 122/88 | HR 78 | Temp 97.3°F | Ht 66.0 in | Wt 194.4 lb

## 2019-11-26 DIAGNOSIS — Z87891 Personal history of nicotine dependence: Secondary | ICD-10-CM

## 2019-11-26 DIAGNOSIS — J449 Chronic obstructive pulmonary disease, unspecified: Secondary | ICD-10-CM

## 2019-11-26 LAB — CBC WITH DIFFERENTIAL/PLATELET
Basophils Absolute: 0.1 10*3/uL (ref 0.0–0.1)
Basophils Relative: 0.6 % (ref 0.0–3.0)
Eosinophils Absolute: 0.2 10*3/uL (ref 0.0–0.7)
Eosinophils Relative: 2.5 % (ref 0.0–5.0)
HCT: 40.3 % (ref 39.0–52.0)
Hemoglobin: 13.2 g/dL (ref 13.0–17.0)
Lymphocytes Relative: 20.5 % (ref 12.0–46.0)
Lymphs Abs: 1.9 10*3/uL (ref 0.7–4.0)
MCHC: 32.8 g/dL (ref 30.0–36.0)
MCV: 95.3 fl (ref 78.0–100.0)
Monocytes Absolute: 0.6 10*3/uL (ref 0.1–1.0)
Monocytes Relative: 6.2 % (ref 3.0–12.0)
Neutro Abs: 6.5 10*3/uL (ref 1.4–7.7)
Neutrophils Relative %: 70.2 % (ref 43.0–77.0)
Platelets: 272 10*3/uL (ref 150.0–400.0)
RBC: 4.23 Mil/uL (ref 4.22–5.81)
RDW: 14.4 % (ref 11.5–15.5)
WBC: 9.2 10*3/uL (ref 4.0–10.5)

## 2019-11-26 NOTE — Progress Notes (Signed)
Jeff Cruz    [id number]    16-Jan-1966  Primary Care Physician:Golding, Jonny Ruiz, MD  Referring Physician: Assunta Found, MD [address] [place],  [place] [address]  Chief complaint: Follow-up for COPD  HPI: 54 year old with severe COPD, mild secondary pulmonary hypertension, heavy ex-smoker.  Former patient of Dr. Kriste Basque Was initially maintained on Anoro.  Inhalers changed to [facility] in April 2021 by his primary care.  Patient feels that the new inhaler works better Complains of chronic cough with white mucus, dyspnea on exertion at baseline.  Denies any snoring, excessive daytime sleepiness.  Currently on nocturnal oxygen.  Pets: 2 dogs, no birds Occupation: Works as a Energy manager.  Repairs boat motors Exposures: No known exposures.  No mold, hot tub, Jacuzzi.  No feather pillows or comforters Smoking history: 40-60-pack-year smoker.  Quit in 2020 Travel history: No significant travel history Relevant family history: Mom had COPD.  She was a smoker   Outpatient Encounter Medications as of 11/26/2019  Medication Sig   Albuterol Sulfate (PROAIR RESPICLICK) 108 (90 Base) MCG/ACT AEPB Inhale 1-2 puffs into the lungs every 6 (six) hours as needed.   BREZTRI AEROSPHERE 160-9-4.8 MCG/ACT AERO INHALE 2 PUFFS INTO THENLUNGS 2 TIMES DAILY INDTHE MORNING AND EVENING.   celecoxib (CELEBREX) 200 MG capsule Take 200 mg by mouth daily.   cyclobenzaprine (FLEXERIL) 10 MG tablet Take 10 mg by mouth 3 (three) times daily as needed for muscle spasms.   meloxicam (MOBIC) 15 MG tablet Take 7.5 mg by mouth daily.   [DISCONTINUED] ANORO ELLIPTA 62.5-25 MCG/INH AEPB INHALE 1 PUFF INTO THE LUNGS DAILY.   No facility-administered encounter medications on file as of 11/26/2019.    Allergies as of 11/26/2019   (No Known Allergies)    Past Medical History:  Diagnosis Date   Asthma    COPD (chronic obstructive pulmonary disease) (HCC)    Nocturnal hypoxemia      History reviewed. No pertinent surgical history.  Family History  Problem Relation Age of Onset   Diabetes Mother    Heart disease Father    Heart disease Maternal Grandmother    Heart disease Maternal Grandfather    Heart disease Paternal Grandmother    Heart disease Paternal Grandfather     Social History   Socioeconomic History   Marital status: Divorced    Spouse name: Not on file   Number of children: Not on file   Years of education: Not on file   Highest education level: Not on file  Occupational History   Not on file  Tobacco Use   Smoking status: Former Smoker    Packs/day: 1.00    Years: 35.00    Pack years: 35.00    Types: Cigarettes    Quit date: 06/12/2017    Years since quitting: 2.4   Smokeless tobacco: Never Used  Vaping Use   Vaping Use: Never used  Substance and Sexual Activity   Alcohol use: Yes    Comment: social   Drug use: No   Sexual activity: Not on file  Other Topics Concern   Not on file  Social History Narrative   Not on file   Social Determinants of Health   Financial Resource Strain:    Difficulty of Paying Living Expenses: Not on file  Food Insecurity:    Worried About Running Out of Food in the Last Year: Not on file   [employer] of Food in the Last Year:  Not on file  Transportation Needs:    Lack of Transportation (Medical): Not on file   Lack of Transportation (Non-Medical): Not on file  Physical Activity:    Days of Exercise per Week: Not on file   Minutes of Exercise per Session: Not on file  Stress:    Feeling of Stress : Not on file  Social Connections:    Frequency of Communication with Friends and Family: Not on file   Frequency of Social Gatherings with Friends and Family: Not on file   Attends Religious Services: Not on file   Active Member of Clubs or Organizations: Not on file   Attends Banker Meetings: Not on file   Marital Status: Not on file  Intimate Partner  Violence:    Fear of Current or Ex-Partner: Not on file   Emotionally Abused: Not on file   Physically Abused: Not on file   Sexually Abused: Not on file    Review of systems: Review of Systems  Constitutional: Negative for fever and chills.  HENT: Negative.   Eyes: Negative for blurred vision.  Respiratory: as per HPI  Cardiovascular: Negative for chest pain and palpitations.  Gastrointestinal: Negative for vomiting, diarrhea, blood per rectum. Genitourinary: Negative for dysuria, urgency, frequency and hematuria.  Musculoskeletal: Negative for myalgias, back pain and joint pain.  Skin: Negative for itching and rash.  Neurological: Negative for dizziness, tremors, focal weakness, seizures and loss of consciousness.  Endo/Heme/Allergies: Negative for environmental allergies.  Psychiatric/Behavioral: Negative for depression, suicidal ideas and hallucinations.  All other systems reviewed and are negative.  Physical Exam: Blood pressure 122/88, pulse 78, temperature (!) 97.3 F (36.3 C), temperature source Oral, height 5\' 6"  (1.676 m), weight 194 lb 6.4 oz (88.2 kg), SpO2 94 %. Gen:      No acute distress HEENT:  EOMI, sclera anicteric Neck:     No masses; no thyromegaly Lungs:    Clear to auscultation bilaterally; normal respiratory effort CV:         Regular rate and rhythm; no murmurs Abd:      + bowel sounds; soft, non-tender; no palpable masses, no distension Ext:    No edema; adequate peripheral perfusion Skin:      Warm and dry; no rash Neuro: alert and oriented x 3 Psych: normal mood and affect  Data Reviewed: Imaging: CT high-resolution 11/01/2016-emphysema, 1 cm right lower lobe pulmonary nodule, dilated pulmonary artery CT chest 05/31/2017-resolution of right lower lobe nodule. Chest x-ray 12/27/2017-emphysematous changes with mild subsegmental atelectasis   PFTs: 08/22/2017 FVC 1.9 [43%], FEV1 0.9 [26%], F/F 48 Very severe  obstruction.  Labs:  Cardiac: Echocardiogram 11/08/2016-LVEF 65 to 70%, grade 1 diastolic dysfunction, PA peak pressure of 45  Assessment:  Severe COPD Currently on breztri inhaler with stable symptoms He did not desat on exertion Discussed pulmonary rehab but he will not be able to get away from work  Check baseline labs including CBC differential, IgE, alpha-1 antitrypsin and chest x-ray Schedule PFTs interested for low-dose screening CT of the chest  Health maintenance He has COVID-19 vaccine hesitancy.  Discussed with patient and wife and encouraged him strongly to get the vaccine  Plan/Recommendations: Continue breztri CBC, IgE, alpha-1 antitrypsin Chest x-ray Schedule PFTs and low-dose screening CT chest  11/10/2016 MD [facility] 11/26/2019, 8:47 AM  CC: 11/28/2019, MD

## 2019-11-26 NOTE — Patient Instructions (Signed)
Continue Breztri inhaler We will check some labs today including CBC differential, IgE, alpha-1 antitrypsin levels and phenotype Get a chest x-ray  Refer for PFTs and low-dose screening CT of the chest Follow-up in 6 months.

## 2019-11-26 NOTE — Addendum Note (Signed)
Addended by: Demetrio Lapping E on: 11/26/2019 09:29 AM   Modules accepted: Orders

## 2019-11-26 NOTE — Addendum Note (Signed)
Addended bySandra Cockayne on: 11/26/2019 09:17 AM   Modules accepted: Orders

## 2019-12-03 ENCOUNTER — Encounter: Payer: Self-pay | Admitting: *Deleted

## 2019-12-06 LAB — ALPHA-1 ANTITRYPSIN PHENOTYPE: A-1 Antitrypsin, Ser: 139 mg/dL (ref 83–199)

## 2019-12-06 LAB — IGE: IgE (Immunoglobulin E), Serum: 31 kU/L (ref <=114)

## 2019-12-10 ENCOUNTER — Inpatient Hospital Stay: Admission: RE | Admit: 2019-12-10 | Payer: 59 | Source: Ambulatory Visit

## 2019-12-18 ENCOUNTER — Inpatient Hospital Stay: Admission: RE | Admit: 2019-12-18 | Payer: 59 | Source: Ambulatory Visit

## 2020-04-30 ENCOUNTER — Encounter: Payer: Self-pay | Admitting: Pulmonary Disease

## 2020-04-30 ENCOUNTER — Ambulatory Visit: Payer: 59 | Admitting: Pulmonary Disease

## 2020-04-30 ENCOUNTER — Other Ambulatory Visit: Payer: Self-pay

## 2020-04-30 VITALS — BP 144/84 | HR 79 | Temp 97.1°F | Ht 66.0 in | Wt 189.0 lb

## 2020-04-30 DIAGNOSIS — J449 Chronic obstructive pulmonary disease, unspecified: Secondary | ICD-10-CM

## 2020-04-30 MED ORDER — IPRATROPIUM-ALBUTEROL 0.5-2.5 (3) MG/3ML IN SOLN: 3.0000 mL | Freq: Four times a day (QID) | RESPIRATORY_TRACT | Status: DC

## 2020-04-30 MED ORDER — IPRATROPIUM-ALBUTEROL 0.5-2.5 (3) MG/3ML IN SOLN: 3.0000 mL | Freq: Four times a day (QID) | RESPIRATORY_TRACT | 5 refills | Status: DC

## 2020-04-30 MED ORDER — BREZTRI AEROSPHERE 160-9-4.8 MCG/ACT IN AERO: 2.0000 | INHALATION_SPRAY | Freq: Two times a day (BID) | RESPIRATORY_TRACT | 0 refills | Status: DC

## 2020-04-30 NOTE — Progress Notes (Signed)
Enzio Buchler    [id number]    1965/07/31  Primary Care Physician:Golding, Jonny Ruiz, MD  Referring Physician: Assunta Found, MD [address] [place],  [place] [address]  Chief complaint: Follow-up for COPD  HPI: 55 year old with severe COPD, mild secondary pulmonary hypertension, heavy ex-smoker.  Former patient of Dr. Kriste Basque Was initially maintained on Anoro.  Inhalers changed to [facility] in April 2021 by his primary care.  Patient feels that the new inhaler works better Complains of chronic cough with white mucus, dyspnea on exertion at baseline.  Denies any snoring, excessive daytime sleepiness.  Currently on nocturnal oxygen.  Pets: 2 dogs, no birds Occupation: Works as a Energy manager.  Repairs boat motors Exposures: No known exposures.  No mold, hot tub, Jacuzzi.  No feather pillows or comforters Smoking history: 40-60-pack-year smoker.  Quit in 2020 Travel history: No significant travel history Relevant family history: Mom had COPD.  She was a smoker  Interim history: Complains of mild increase in dyspnea on exertion.  He continues to use the breztri which does not help all the time.  He is taking extra doses to help with his breathing.  He was referred for PFTs and screening CT of the chest at last visit but has not returned the call to make an appointment.  Outpatient Encounter Medications as of 04/30/2020  Medication Sig  . Albuterol Sulfate (PROAIR RESPICLICK) 108 (90 Base) MCG/ACT AEPB Inhale 1-2 puffs into the lungs every 6 (six) hours as needed.  Marland Kitchen BREZTRI AEROSPHERE 160-9-4.8 MCG/ACT AERO 2 puffs in the morning and at bedtime.  . celecoxib (CELEBREX) 200 MG capsule Take 200 mg by mouth daily.  . cyclobenzaprine (FLEXERIL) 10 MG tablet Take 10 mg by mouth 3 (three) times daily as needed for muscle spasms.  . meloxicam (MOBIC) 15 MG tablet Take 7.5 mg by mouth daily. (Patient not taking: Reported on 04/30/2020)   No facility-administered encounter  medications on file as of 04/30/2020.    Physical Exam: Blood pressure (!) 144/84, pulse 79, temperature (!) 97.1 F (36.2 C), temperature source Temporal, height 5\' 6"  (1.676 m), weight 189 lb (85.7 kg), SpO2 96 %. Gen:      No acute distress HEENT:  EOMI, sclera anicteric Neck:     No masses; no thyromegaly Lungs:    Scattered crackles CV:         Regular rate and rhythm; no murmurs Abd:      + bowel sounds; soft, non-tender; no palpable masses, no distension Ext:    No edema; adequate peripheral perfusion Skin:      Warm and dry; no rash Neuro: alert and oriented x 3 Psych: normal mood and affect  Data Reviewed: Imaging: CT high-resolution 11/01/2016-emphysema, 1 cm right lower lobe pulmonary nodule, dilated pulmonary artery CT chest 05/31/2017-resolution of right lower lobe nodule. Chest x-ray 12/27/2017-emphysematous changes with mild subsegmental atelectasis Chest x-ray 11/26/2019-no acute cardiopulmonary abnormality. I have reviewed the images personally.  PFTs: 08/22/2017 FVC 1.9 [43%], FEV1 0.9 [26%], F/F 48 Very severe obstruction.  Labs: CBC 9/40/21-WBC 9.2, eos 2.5%, absolute eosinophil count 230 IgE 11/26/2018 1-31 Alpha-1 antitrypsin 11/26/2019-139, PI MM  Cardiac: Echocardiogram 11/08/2016-LVEF 65 to 70%, grade 1 diastolic dysfunction, PA peak pressure of 45  Assessment:  Severe COPD Currently on breztri inhaler with slightly worsening symptoms Start duo nebs 4 times a day as needed  Referral to pulmonary rehab in [place] Reorder PFTs and low-dose screening CTs of the chest as  they were not completed  He may be a candidate for transplant.  We will make the decision after review of PFTs and screening CT chest.  Health maintenance He has COVID-19 vaccine hesitancy.  Discussed with patient and wife and encouraged him strongly to get the vaccine  He is up-to-date with flu and pneumonia vaccine  Plan/Recommendations: Continue breztri Add duo nebs Reorder  PFTs, screening CT chest Referral to pulmonary rehab.  Chilton Greathouse MD [facility] 04/30/2020, 9:45 AM  CC: Assunta Found, MD

## 2020-04-30 NOTE — Patient Instructions (Signed)
Continue the breztri We will order duo nebs use 4 times a day as needed with a nebulizer  We will reorder PFTs and referral for low-dose screening CT Referral to pulmonary rehab at [facility]  Follow-up in 6 months.

## 2020-04-30 NOTE — Addendum Note (Signed)
Addended by: Dorisann Frames R on: 04/30/2020 10:50 AM   Modules accepted: Orders

## 2020-05-12 ENCOUNTER — Inpatient Hospital Stay: Admission: RE | Admit: 2020-05-12 | Payer: 59 | Source: Ambulatory Visit

## 2020-05-27 ENCOUNTER — Ambulatory Visit: Payer: 59 | Admitting: Pulmonary Disease

## 2020-06-01 ENCOUNTER — Encounter: Payer: Self-pay | Admitting: Pulmonary Disease

## 2020-06-01 ENCOUNTER — Ambulatory Visit: Payer: 59 | Admitting: Pulmonary Disease

## 2020-06-01 ENCOUNTER — Ambulatory Visit (INDEPENDENT_AMBULATORY_CARE_PROVIDER_SITE_OTHER)
Admission: RE | Admit: 2020-06-01 | Discharge: 2020-06-01 | Disposition: A | Discharge status: Home / Self Care | Payer: 59 | Source: Ambulatory Visit | Attending: Pulmonary Disease | Admitting: Pulmonary Disease

## 2020-06-01 ENCOUNTER — Other Ambulatory Visit: Payer: Self-pay

## 2020-06-01 VITALS — BP 144/74 | HR 76 | Temp 97.1°F | Ht 67.0 in | Wt 188.6 lb

## 2020-06-01 DIAGNOSIS — J449 Chronic obstructive pulmonary disease, unspecified: Secondary | ICD-10-CM | POA: Diagnosis not present

## 2020-06-01 DIAGNOSIS — Z87891 Personal history of nicotine dependence: Secondary | ICD-10-CM | POA: Diagnosis not present

## 2020-06-01 MED ORDER — COMBIVENT RESPIMAT 20-100 MCG/ACT IN AERS: 1.0000 | INHALATION_SPRAY | Freq: Four times a day (QID) | RESPIRATORY_TRACT | 2 refills | Status: DC

## 2020-06-01 NOTE — Addendum Note (Signed)
Addended by: Melonie [place] on: 06/01/2020 10:35 AM   Modules accepted: Orders

## 2020-06-01 NOTE — Patient Instructions (Signed)
We will stop the albuterol inhaler and start you on Combivent Use this is a rescue inhaler for the time she need an extra help We will reschedule the PFTs to be done at [facility]  I will review the CT of the chest when done later today  Follow-up in 6 months

## 2020-06-01 NOTE — Progress Notes (Signed)
Jeff Cruz    [id number]    1965/11/09  Primary Care Physician:Golding, Jonny Ruiz, MD  Referring Physician: Assunta Found, MD [address] [place],  [place] [address]  Chief complaint: Follow-up for COPD  HPI: 55 year old with severe COPD, mild secondary pulmonary hypertension, heavy ex-smoker.  Was initially maintained on Anoro.  Inhalers changed to [facility] in April 2021 by his primary care.  Patient feels that the new inhaler works better Complains of chronic cough with white mucus, dyspnea on exertion at baseline.  Denies any snoring, excessive daytime sleepiness.  Currently on nocturnal oxygen.  Pets: 2 dogs, no birds Occupation: Works as a Energy manager.  Repairs boat motors Exposures: No known exposures.  No mold, hot tub, Jacuzzi.  No feather pillows or comforters Smoking history: 40-60-pack-year smoker.  Quit in 2020 Travel history: No significant travel history Relevant family history: Mom had COPD.  She was a smoker  Interim history: Continues on breztri inhaler.  He is taking extra puffs of the breztri to help with the breathing as he gets short of breath while lifting heavy weights at work He is on duo nebs which helps and also albuterol rescue inhaler.   Outpatient Encounter Medications as of 06/01/2020  Medication Sig  . Albuterol Sulfate (PROAIR RESPICLICK) 108 (90 Base) MCG/ACT AEPB Inhale 1-2 puffs into the lungs every 6 (six) hours as needed.  . Budeson-Glycopyrrol-Formoterol (BREZTRI AEROSPHERE) 160-9-4.8 MCG/ACT AERO Inhale 2 puffs into the lungs in the morning and at bedtime.  . celecoxib (CELEBREX) 200 MG capsule Take 200 mg by mouth daily.  . cyclobenzaprine (FLEXERIL) 10 MG tablet Take 10 mg by mouth 3 (three) times daily as needed for muscle spasms.  Marland Kitchen ipratropium-albuterol (DUONEB) 0.5-2.5 (3) MG/3ML SOLN Take 3 mLs by nebulization in the morning, at noon, in the evening, and at bedtime.  . meloxicam (MOBIC) 15 MG tablet Take 7.5 mg  by mouth daily.  . [DISCONTINUED] BREZTRI AEROSPHERE 160-9-4.8 MCG/ACT AERO 2 puffs in the morning and at bedtime.   No facility-administered encounter medications on file as of 06/01/2020.    Physical Exam: Blood pressure (!) 144/74, pulse 76, temperature (!) 97.1 F (36.2 C), temperature source Temporal, height 5\' 7"  (1.702 m), weight 188 lb 9.6 oz (85.5 kg), SpO2 97 %. Gen:      No acute distress HEENT:  EOMI, sclera anicteric Neck:     No masses; no thyromegaly Lungs:    Diminished air entry, no wheeze CV:         Regular rate and rhythm; no murmurs Abd:      + bowel sounds; soft, non-tender; no palpable masses, no distension Ext:    No edema; adequate peripheral perfusion Skin:      Warm and dry; no rash Neuro: alert and oriented x 3 Psych: normal mood and affect  Data Reviewed: Imaging: CT high-resolution 11/01/2016-emphysema, 1 cm right lower lobe pulmonary nodule, dilated pulmonary artery CT chest 05/31/2017-resolution of right lower lobe nodule. Chest x-ray 12/27/2017-emphysematous changes with mild subsegmental atelectasis Chest x-ray 11/26/2019-no acute cardiopulmonary abnormality. I have reviewed the images personally.  PFTs: 08/22/2017 FVC 1.9 [43%], FEV1 0.9 [26%], F/F 48 Very severe obstruction.  Labs: CBC 9/40/21-WBC 9.2, eos 2.5%, absolute eosinophil count 230 IgE 11/26/2018 1-31 Alpha-1 antitrypsin 11/26/2019-139, PI MM  Cardiac: Echocardiogram 11/08/2016-LVEF 65 to 70%, grade 1 diastolic dysfunction, PA peak pressure of 45  Assessment:  Severe COPD Continues on breztri inhaler Continue duo nebs Stop albuterol and  start Combivent as a rescue medication  We have referred him to pulmonary rehab at [facility] and he needs to return the call and schedule appointment Reorder PFTs Low-dose screening CT of the chest scheduled for later today  He may be a candidate for transplant.  We will make the decision after review of PFTs and screening CT chest.  Health  maintenance He has COVID-19 vaccine hesitancy.  Discussed with patient and wife and encouraged him strongly to get the vaccine  He is up-to-date with flu and pneumonia vaccine  Plan/Recommendations: Continue breztri Add duo nebs Reorder PFTs, screening CT chest Referral to pulmonary rehab.  Chilton Greathouse MD [facility] 06/01/2020, 10:15 AM  CC: Assunta Found, MD

## 2020-08-14 ENCOUNTER — Other Ambulatory Visit: Payer: Self-pay | Admitting: Pulmonary Disease

## 2020-12-04 ENCOUNTER — Other Ambulatory Visit: Payer: Self-pay | Admitting: Pulmonary Disease

## 2021-01-28 ENCOUNTER — Telehealth: Payer: Self-pay | Admitting: Pulmonary Disease

## 2021-01-28 NOTE — Telephone Encounter (Signed)
LMTCB   Patient will need to come to office to be qualified for POC. Once we speak with patient we can send to MD regarding flying on air plane.

## 2021-01-29 NOTE — Telephone Encounter (Signed)
Called Jeff Cruz and there was no answer and no option to leave msg  Pt needs ov with walk to see if qualifies for POC Pt overdue f/u here  Closing encounter per protocol

## 2021-03-11 ENCOUNTER — Telehealth: Payer: Self-pay | Admitting: Pulmonary Disease

## 2021-03-11 MED ORDER — IPRATROPIUM-ALBUTEROL 0.5-2.5 (3) MG/3ML IN SOLN
3.0000 mL | Freq: Four times a day (QID) | RESPIRATORY_TRACT | 1 refills | Status: DC
Start: 2021-03-11 — End: 2022-08-10

## 2021-03-11 MED ORDER — BREZTRI AEROSPHERE 160-9-4.8 MCG/ACT IN AERO: 2.0000 | INHALATION_SPRAY | Freq: Two times a day (BID) | RESPIRATORY_TRACT | 1 refills | Status: DC

## 2021-03-11 NOTE — Telephone Encounter (Signed)
Rx Breztri and duoneb has been sent to preferred pharmacy.  Patient's wife, Renee(dpr) is aware and voiced her understanding.  Nothing further needed.

## 2021-03-24 ENCOUNTER — Encounter: Payer: Self-pay | Admitting: Pulmonary Disease

## 2021-03-24 ENCOUNTER — Ambulatory Visit: Payer: 59 | Admitting: Pulmonary Disease

## 2021-03-24 ENCOUNTER — Other Ambulatory Visit: Payer: Self-pay

## 2021-03-24 VITALS — BP 134/76 | HR 81 | Temp 98.3°F | Ht 67.0 in | Wt 185.0 lb

## 2021-03-24 DIAGNOSIS — J449 Chronic obstructive pulmonary disease, unspecified: Secondary | ICD-10-CM | POA: Diagnosis not present

## 2021-03-24 MED ORDER — TRELEGY ELLIPTA 200-62.5-25 MCG/ACT IN AEPB: 1.0000 | INHALATION_SPRAY | Freq: Every day | RESPIRATORY_TRACT | 2 refills | Status: DC

## 2021-03-24 NOTE — Progress Notes (Signed)
Khallid Knabb    OD:[id number]    May 24, 1965  Primary Care Physician:Golding, Jenny Reichmann, MD  Referring Physician: Sharilyn Sites, MD [address] [place],  [place] [address]  Chief complaint: Follow-up for COPD  HPI: 56 year old with severe COPD, mild secondary pulmonary hypertension, heavy ex-smoker.  Was initially maintained on Anoro.  Inhalers changed to [facility] in April 2021 by his primary care.  Patient feels that the new inhaler works better Complains of chronic cough with white mucus, dyspnea on exertion at baseline.  Denies any snoring, excessive daytime sleepiness.  Currently on nocturnal oxygen.  Pets: 2 dogs, no birds Occupation: Works as a IT sales professional.  Repairs boat motors Exposures: No known exposures.  No mold, hot tub, Jacuzzi.  No feather pillows or comforters Smoking history: 40-60-pack-year smoker.  Quit in 2020 Travel history: No significant travel history Relevant family history: Mom had COPD.  She was a smoker  Interim history: Continues on breztri inhaler.  He tried Trelegy sample recently and feels that this works better and wants to make the switch  Overall dyspnea on exertion is stable with no change Low-dose screening CT reviewed with no worrisome findings  He was referred to for pulmonary rehab and repeat PFTs but he did not follow through with appointment Wants smaller oxygen tanks as he is planning a trip to [facility] Encounter Medications as of 03/24/2021  Medication Sig   Budeson-Glycopyrrol-Formoterol (BREZTRI AEROSPHERE) 160-9-4.8 MCG/ACT AERO Inhale 2 puffs into the lungs in the morning and at bedtime.   celecoxib (CELEBREX) 200 MG capsule Take 200 mg by mouth daily.   COMBIVENT RESPIMAT 20-100 MCG/ACT AERS respimat INHALE 1 PUFF EVERY 6 HOURS   ipratropium-albuterol (DUONEB) 0.5-2.5 (3) MG/3ML SOLN Take 3 mLs by nebulization in the morning, at noon, in the evening, and at bedtime.   meloxicam (MOBIC) 15 MG tablet Take  7.5 mg by mouth daily.   [DISCONTINUED] cyclobenzaprine (FLEXERIL) 10 MG tablet Take 10 mg by mouth 3 (three) times daily as needed for muscle spasms.   No facility-administered encounter medications on file as of 03/24/2021.    Physical Exam: Gen:      No acute distress HEENT:  EOMI, sclera anicteric Neck:     No masses; no thyromegaly Lungs:    Clear to auscultation bilaterally; normal respiratory effort CV:         Regular rate and rhythm; no murmurs Abd:      + bowel sounds; soft, non-tender; no palpable masses, no distension Ext:    No edema; adequate peripheral perfusion Skin:      Warm and dry; no rash Neuro: alert and oriented x 3 Psych: normal mood and affect  Data Reviewed: Imaging: CT high-resolution 11/01/2016-emphysema, 1 cm right lower lobe pulmonary nodule, dilated pulmonary artery CT chest 05/31/2017-resolution of right lower lobe nodule. Screening CT chest 06/01/2020-mild emphysematous changes.  No lung nodules. I have reviewed the images personally.  PFTs: 08/22/2017 FVC 1.9 [43%], FEV1 0.9 [26%], F/F 48 Very severe obstruction.  Labs: CBC 9/40/21-WBC 9.2, eos 2.5%, absolute eosinophil count 230 IgE 11/26/2018 1-31 Alpha-1 antitrypsin 11/26/2019-139, PI MM  Cardiac: Echocardiogram 11/08/2016-LVEF 65 to [id number], grade 1 diastolic dysfunction, PA peak pressure of 45  Assessment:  Severe COPD He wants to make the switch to Trelegy.  We will stop the breztri and start Trelegy Continue duo nebs  We have referred him to pulmonary rehab at [facility] and he needs to return the call and  schedule appointment Reorder PFTs  He may be a candidate for transplant.  We will make the decision after review of PFTs and screening CT chest.  Ex-smoker Continue low-dose screening CT chest  Health maintenance He has COVID-19 vaccine hesitancy.  Discussed with patient and wife and encouraged him strongly to get the vaccine  He is up-to-date with flu and pneumonia  vaccine  Plan/Recommendations: Change breztri to trelegy Reorder PFTs Referral to pulmonary rehab. Order placed to DME for portable oxygen tanks  Marshell Garfinkel MD [facility] 03/24/2021, 4:28 PM  CC: Sharilyn Sites, MD

## 2021-03-24 NOTE — Patient Instructions (Signed)
We will reorder PFTs and pulmonary function test to be done at  Will change breztri to Trelegy inhaler as he likes this better We will place an order to DME company to get oxygen tanks for travel Follow-up in 6 months

## 2021-03-25 ENCOUNTER — Encounter: Payer: Self-pay | Admitting: Pulmonary Disease

## 2021-03-25 ENCOUNTER — Telehealth: Payer: Self-pay | Admitting: Pharmacy Technician

## 2021-03-25 ENCOUNTER — Other Ambulatory Visit (HOSPITAL_COMMUNITY): Payer: Self-pay

## 2021-03-25 NOTE — Telephone Encounter (Signed)
Patient Advocate Encounter  Received notification from [facility]) that prior authorization for TRELEGY is required.   PA NOT NEEDED. PT MUST CALL [phone] TO REGISTER WITH VARIABLE COPAY AND RECEIVE MEDICATIONS VIA MAIL ORDER. PHARMACY NAME WILL BE GIVEN TO PATIENT. PATIENT WILL GIVE PHARMACY TO PROVIDER.  [facility] [id number] Status is pending   [facility] will continue to follow  Ricke Hey, CPhT Patient Advocate Phone: [phone] Fax:  [phone]

## 2021-03-25 NOTE — Telephone Encounter (Signed)
ATC patient.  LM for patient to call office tomorrow after 8am.

## 2021-03-26 NOTE — Telephone Encounter (Signed)
Called and spoke with pt letting him know the info about the PA and when asked if he could write the phone number down, pt stated that he was currently driving and could not write the number down. Stated to pt that I would call him back and leave him a VM with the phone number and he verbalized understanding. Message left for pt with the number. Nothing further needed.

## 2021-04-21 ENCOUNTER — Telehealth: Payer: Self-pay | Admitting: Pulmonary Disease

## 2021-04-22 ENCOUNTER — Other Ambulatory Visit (HOSPITAL_COMMUNITY): Payer: Self-pay

## 2021-04-24 ENCOUNTER — Other Ambulatory Visit: Payer: Self-pay | Admitting: Pulmonary Disease

## 2021-05-03 NOTE — Telephone Encounter (Signed)
Called and spoke with Renee. She was calling to check on the status of PA for Trelegy. She stated that she had never heard of [facility] and was not sure about having to register in order for the PA to go through.   Patient was last seen on 03/24/21 and has been waiting on his inhaler ever since.   She wants to know if he could go back to the [employer] inhaler.   Pharmacy is [facility] .   Dr. Isaiah Serge, can you please advise if you are ok with him going back to [facility]? Thanks!

## 2021-05-03 NOTE — Telephone Encounter (Signed)
Renee wife checking on prior authorization for Trelegy inhaler. Pharmacy is [facility] [place]. Renee phone number is [phone].

## 2021-05-04 MED ORDER — BREZTRI AEROSPHERE 160-9-4.8 MCG/ACT IN AERO: 2.0000 | INHALATION_SPRAY | Freq: Two times a day (BID) | RESPIRATORY_TRACT | 6 refills | Status: DC

## 2021-05-04 NOTE — Telephone Encounter (Signed)
Spoke to patient's spouse, Renee(DPR).  She is calling for update on trelegy PA. Renee stated that she has not been contacted by [place] [facility] to register.  Patient has completed trelegy sample. Renee stated that patient would like to restart Breztri due to being unable to obtain Trelegy Rx.   Dr. Isaiah Serge, please advise. Thanks

## 2021-05-04 NOTE — Telephone Encounter (Signed)
Patient's spouse, Renee(DPR) is aware of below message and voiced her understanding.  Breztri sent to preferred pharmacy. Nothing further needed.

## 2021-05-04 NOTE — Telephone Encounter (Signed)
Ok to call in breztri order

## 2021-05-13 ENCOUNTER — Ambulatory Visit (HOSPITAL_COMMUNITY)
Admission: RE | Admit: 2021-05-13 | Discharge: 2021-05-13 | Disposition: A | Discharge status: Home / Self Care | Payer: 59 | Source: Ambulatory Visit | Attending: Pulmonary Disease | Admitting: Pulmonary Disease

## 2021-05-13 ENCOUNTER — Other Ambulatory Visit: Payer: Self-pay

## 2021-05-13 DIAGNOSIS — J449 Chronic obstructive pulmonary disease, unspecified: Secondary | ICD-10-CM | POA: Diagnosis present

## 2021-05-13 LAB — PULMONARY FUNCTION TEST
DL/VA % pred: 100 %
DL/VA: 4.4 ml/min/mmHg/L
DLCO unc % pred: 60 %
DLCO unc: 15.08 ml/min/mmHg
FEF 25-75 Post: 0.39 L/sec
FEF 25-75 Pre: 0.29 L/sec
FEF2575-%Change-Post: 31 %
FEF2575-%Pred-Post: 13 %
FEF2575-%Pred-Pre: 10 %
FEV1-%Change-Post: 8 %
FEV1-%Pred-Post: 26 %
FEV1-%Pred-Pre: 24 %
FEV1-Post: 0.88 L
FEV1-Pre: 0.81 L
FEV1FVC-%Change-Post: -3 %
FEV1FVC-%Pred-Pre: 53 %
FEV6-%Change-Post: 8 %
FEV6-%Pred-Post: 48 %
FEV6-%Pred-Pre: 44 %
FEV6-Post: 1.95 L
FEV6-Pre: 1.79 L
FEV6FVC-%Change-Post: -3 %
FEV6FVC-%Pred-Post: 91 %
FEV6FVC-%Pred-Pre: 94 %
FVC-%Change-Post: 12 %
FVC-%Pred-Post: 52 %
FVC-%Pred-Pre: 46 %
FVC-Post: 2.23 L
FVC-Pre: 1.98 L
Post FEV1/FVC ratio: 39 %
Post FEV6/FVC ratio: 88 %
Pre FEV1/FVC ratio: 41 %
Pre FEV6/FVC Ratio: 91 %
RV % pred: 132 %
RV: 2.58 L
TLC % pred: 76 %
TLC: 4.75 L

## 2021-05-13 MED ORDER — ALBUTEROL SULFATE (2.5 MG/3ML) 0.083% IN NEBU
2.5000 mg | INHALATION_SOLUTION | Freq: Once | RESPIRATORY_TRACT | Status: AC
  Administered 2021-05-13: 2.5 mg via RESPIRATORY_TRACT

## 2021-06-17 ENCOUNTER — Encounter (HOSPITAL_COMMUNITY)
Admission: RE | Admit: 2021-06-17 | Discharge: 2021-06-17 | Disposition: A | Discharge status: Home / Self Care | Payer: 59 | Source: Ambulatory Visit | Attending: Pulmonary Disease | Admitting: Pulmonary Disease

## 2021-06-17 ENCOUNTER — Encounter (HOSPITAL_COMMUNITY): Payer: Self-pay

## 2021-06-17 VITALS — BP 118/60 | HR 70 | Ht 67.0 in | Wt 181.7 lb

## 2021-06-17 DIAGNOSIS — J449 Chronic obstructive pulmonary disease, unspecified: Secondary | ICD-10-CM | POA: Insufficient documentation

## 2021-06-17 NOTE — Progress Notes (Signed)
Pulmonary Individual Treatment Plan ? ?Patient Details  ?Name: Jeff Cruz. ?MRN: [id number] ?Date of Birth: [birth year] ?Referring Provider:   ?Flowsheet Row PULMONARY REHAB COPD ORIENTATION from 06/17/2021 in [facility]  ?Referring Provider Dr. Isaiah Serge  ? ?  ? ? ?Initial Encounter Date:  ?Flowsheet Row PULMONARY REHAB COPD ORIENTATION from 06/17/2021 in [place] [facility]  ?Date 06/17/21  ? ?  ? ? ?Visit Diagnosis: COPD mixed type (HCC) ? ?Patient's Home Medications on Admission:  ? ?Current Outpatient Medications:  ?  albuterol (VENTOLIN HFA) 108 (90 Base) MCG/ACT inhaler, Inhale 2 puffs into the lungs every 4 (four) hours as needed., Disp: , Rfl:  ?  benzonatate (TESSALON) 200 MG capsule, Take 200 mg by mouth daily as needed for cough., Disp: , Rfl:  ?  Budeson-Glycopyrrol-Formoterol (BREZTRI AEROSPHERE) 160-9-4.8 MCG/ACT AERO, Inhale 2 puffs into the lungs in the morning and at bedtime., Disp: 10.7 g, Rfl: 6 ?  celecoxib (CELEBREX) 200 MG capsule, Take 200 mg by mouth daily., Disp: , Rfl:  ?  CINNAMON PO, Take 1 tablet by mouth daily., Disp: , Rfl:  ?  COMBIVENT RESPIMAT 20-100 MCG/ACT AERS respimat, INHALE 1 PUFF EVERY 6 HOURS (Patient taking differently: Inhale 1 puff into the lungs every 6 (six) hours as needed.), Disp: 4 g, Rfl: 6 ?  cyclobenzaprine (FLEXERIL) 10 MG tablet, Take 10 mg by mouth every 8 (eight) hours as needed., Disp: , Rfl:  ?  Ginger, Zingiber officinalis, (GINGER PO), Take 1 tablet by mouth daily., Disp: , Rfl:  ?  ipratropium-albuterol (DUONEB) 0.5-2.5 (3) MG/3ML SOLN, Take 3 mLs by nebulization in the morning, at noon, in the evening, and at bedtime. (Patient taking differently: Take 3 mLs by nebulization in the morning and at bedtime.), Disp: 360 mL, Rfl: 1 ?  levalbuterol (XOPENEX HFA) 45 MCG/ACT inhaler, Inhale 1 puff into the lungs every 4 (four) hours as needed for shortness of breath., Disp: , Rfl:  ?  MAGNESIUM PO, Take 1 tablet by mouth daily.,  Disp: , Rfl:  ?  meloxicam (MOBIC) 15 MG tablet, Take 7.5 mg by mouth daily., Disp: , Rfl:  ?  Multiple Vitamins-Minerals (ZINC PO), Take 1 tablet by mouth daily., Disp: , Rfl:  ?  Omega-3 Fatty Acids (FISH OIL PO), Take 1 tablet by mouth daily., Disp: , Rfl:  ?  TURMERIC PO, Take 1 tablet by mouth daily., Disp: , Rfl:  ? ?Past Medical History: ?Past Medical History:  ?Diagnosis Date  ? Asthma   ? COPD (chronic obstructive pulmonary disease) (HCC)   ? Nocturnal hypoxemia   ? ? ?Tobacco Use: ?Social History  ? ?Tobacco Use  ?Smoking Status Former  ? Packs/day: 1.00  ? Years: 35.00  ? Pack years: 35.00  ? Types: Cigarettes  ? Quit date: 06/12/2017  ? Years since quitting: 4.0  ?Smokeless Tobacco Never  ? ? ?Labs: ?Review Flowsheet   ? ?    ? View : No data to display.  ?  ?  ?  ?  ?  ? ? ?Capillary Blood Glucose: ?No results found for: GLUCAP ? ? ?Pulmonary Assessment Scores: ? Pulmonary Assessment Scores   ? ? Row Name 06/17/21 8299  ?  ?  ?  ? ADL UCSD  ? ADL Phase Entry    ? SOB Score total 58    ? Rest 1    ? Walk 3    ? Stairs 5    ? Bath 4    ?  Dress 4    ? Shop 1    ?  ? CAT Score  ? CAT Score 23    ?  ? mMRC Score  ? mMRC Score 1    ? ?  ?  ? ?  ? ?UCSD: ?Self-administered rating of dyspnea associated with activities of daily living (ADLs) ?6-point scale (0 = "not at all" to 5 = "maximal or unable to do because of breathlessness")  ?Scoring Scores range from 0 to 120.  Minimally important difference is 5 units ? ?CAT: ?CAT can identify the health impairment of COPD patients and is better correlated with disease progression.  ?CAT has a scoring range of zero to 40. The CAT score is classified into four groups of low (less than 10), medium (10 - 20), high (21-30) and very high (31-40) based on the impact level of disease on health status. A CAT score over 10 suggests significant symptoms.  A worsening CAT score could be explained by an exacerbation, poor medication adherence, poor inhaler technique, or progression  of COPD or comorbid conditions.  ?CAT MCID is 2 points ? ?mMRC: ?mMRC (Modified Medical Research Council) Dyspnea Scale is used to assess the degree of baseline functional disability in patients of respiratory disease due to dyspnea. ?No minimal important difference is established. A decrease in score of 1 point or greater is considered a positive change.  ? ?Pulmonary Function Assessment: ? ? ?Exercise Target Goals: ?Exercise Program Goal: ?Individual exercise prescription set using results from initial 6 min walk test and THRR while considering  patient?s activity barriers and safety.  ? ?Exercise Prescription Goal: ?Initial exercise prescription builds to 30-45 minutes a day of aerobic activity, 2-3 days per week.  Home exercise guidelines will be given to patient during program as part of exercise prescription that the participant will acknowledge. ? ?Activity Barriers & Risk Stratification: ? Activity Barriers & Cardiac Risk Stratification - 06/17/21 0855   ? ?  ? Activity Barriers & Cardiac Risk Stratification  ? Activity Barriers Arthritis;Back Problems;Neck/Spine Problems;Shortness of Breath   ? Cardiac Risk Stratification Low   ? ?  ?  ? ?  ? ? ?6 Minute Walk: ? 6 Minute Walk   ? ? Row Name 06/17/21 1017  ?  ?  ?  ? 6 Minute Walk  ? Phase Initial    ? Distance 1250 feet    ? Walk Time 6 minutes    ? # of Rest Breaks 0    ? MPH 2.37    ? METS 3.52    ? RPE 11    ? Perceived Dyspnea  9    ? VO2 Peak 12.32    ? Symptoms No    ? Resting HR 70 bpm    ? Resting BP 118/60    ? Resting Oxygen Saturation  93 %    ? Exercise Oxygen Saturation  during 6 min walk 86 %    ? Max Ex. HR 87 bpm    ? Max Ex. BP 150/70    ? 2 Minute Post BP 124/60    ?  ? Interval HR  ? 1 Minute HR 84    ? 2 Minute HR 86    ? 3 Minute HR 86    ? 4 Minute HR 87    ? 5 Minute HR 86    ? 6 Minute HR 87    ? 2 Minute Post HR 69    ? Interval Heart Rate? Yes    ?  ?  Interval Oxygen  ? Interval Oxygen? Yes    ? Baseline Oxygen Saturation % 93 %     ? 1 Minute Oxygen Saturation % 89 %    ? 1 Minute Liters of Oxygen 0 L    ? 2 Minute Oxygen Saturation % 88 %    ? 2 Minute Liters of Oxygen 0 L    ? 3 Minute Oxygen Saturation % 87 %    ? 3 Minute Liters of Oxygen 0 L    ? 4 Minute Oxygen Saturation % 87 %    ? 4 Minute Liters of Oxygen 0 L    ? 5 Minute Oxygen Saturation % 86 %    ? 5 Minute Liters of Oxygen 0 L    ? 6 Minute Oxygen Saturation % 88 %    ? 6 Minute Liters of Oxygen 0 L    ? 2 Minute Post Oxygen Saturation % 91 %    ? 2 Minute Post Liters of Oxygen 0 L    ? ?  ?  ? ?  ? ? ?Oxygen Initial Assessment: ? Oxygen Initial Assessment - 06/17/21 1009   ? ?  ? Home Oxygen  ? Home Oxygen Device Home Concentrator   ? Sleep Oxygen Prescription Continuous   ? Liters per minute 2   Pt gave several different answers ranging from 2 to 5 L. Dr. Shirlee More notes state 2 L  ? Home Exercise Oxygen Prescription None   ? Home Resting Oxygen Prescription None   ? Compliance with Home Oxygen Use Yes   ?  ? Initial 6 min Walk  ? Oxygen Used None   ?  ? Program Oxygen Prescription  ? Program Oxygen Prescription None   ?  ? Intervention  ? Short Term Goals To learn and understand importance of maintaining oxygen saturations>88%;To learn and demonstrate proper pursed lip breathing techniques or other breathing techniques. ;To learn and demonstrate proper use of respiratory medications;To learn and exhibit compliance with exercise, home and travel O2 prescription   ? Long  Term Goals Compliance with respiratory medication;Exhibits proper breathing techniques, such as pursed lip breathing or other method taught during program session;Maintenance of O2 saturations>88%;Verbalizes importance of monitoring SPO2 with pulse oximeter and return demonstration;Exhibits compliance with exercise, home  and travel O2 prescription   ? ?  ?  ? ?  ? ? ?Oxygen Re-Evaluation: ? ? ?Oxygen Discharge (Final Oxygen Re-Evaluation): ? ? ?Initial Exercise Prescription: ? Initial Exercise Prescription -  06/17/21 1000   ? ?  ? Date of Initial Exercise RX and Referring Provider  ? Date 06/17/21   ? Referring Provider Dr. Isaiah Serge   ? Expected Discharge Date 10/21/21   ?  ? Treadmill  ? MPH 1.2   ? Tami Ribas

## 2021-06-22 ENCOUNTER — Encounter (HOSPITAL_COMMUNITY): Payer: 59

## 2021-06-23 NOTE — Progress Notes (Signed)
Discharge Progress Report ? ?Patient Details  ?Name: Jeff Cruz. ?MRN: [id number] ?Date of Birth: April 01, 1965 ?Referring Provider:   ?Flowsheet Row PULMONARY REHAB COPD ORIENTATION from 06/17/2021 in [facility] [place] [facility]  ?Referring Provider Dr. Isaiah Serge  ? ?  ? ? ? ?Number of Visits: 1 ? ?Reason for Discharge:  ?Early Exit:  Back to work ? ?Smoking History:  ?Social History  ? ?Tobacco Use  ?Smoking Status Former  ? Packs/day: 1.00  ? Years: 35.00  ? Pack years: 35.00  ? Types: Cigarettes  ? Quit date: 06/12/2017  ? Years since quitting: 4.0  ?Smokeless Tobacco Never  ? ? ?Diagnosis:  ?COPD mixed type (HCC) ? ?ADL UCSD: ? Pulmonary Assessment Scores   ? ? Row Name 06/17/21 6568  ?  ?  ?  ? ADL UCSD  ? ADL Phase Entry    ? SOB Score total 58    ? Rest 1    ? Walk 3    ? Stairs 5    ? Bath 4    ? Dress 4    ? Shop 1    ?  ? CAT Score  ? CAT Score 23    ?  ? mMRC Score  ? mMRC Score 1    ? ?  ?  ? ?  ? ? ?Initial Exercise Prescription: ? Initial Exercise Prescription - 06/17/21 1000   ? ?  ? Date of Initial Exercise RX and Referring Provider  ? Date 06/17/21   ? Referring Provider Dr. Isaiah Serge   ? Expected Discharge Date 10/21/21   ?  ? Treadmill  ? MPH 1.2   ? Grade 0   ? Minutes 17   ?  ? NuStep  ? Level 1   ? SPM 80   ? Minutes 22   ?  ? Prescription Details  ? Frequency (times per week) 2   ? Duration Progress to 30 minutes of continuous aerobic without signs/symptoms of physical distress   ?  ? Intensity  ? THRR 40-80% of Max Heartrate 66-131   ? Ratings of Perceived Exertion 11-13   ? Perceived Dyspnea 0-4   ?  ? Resistance Training  ? Training Prescription Yes   ? Weight 4   ? Reps 10-15   ? ?  ?  ? ?  ? ? ?Discharge Exercise Prescription (Final Exercise Prescription Changes): ? ? ?Functional Capacity: ? 6 Minute Walk   ? ? Row Name 06/17/21 1017  ?  ?  ?  ? 6 Minute Walk  ? Phase Initial    ? Distance 1250 feet    ? Walk Time 6 minutes    ? # of Rest Breaks 0    ? MPH 2.37    ? METS 3.52    ? RPE 11     ? Perceived Dyspnea  9    ? VO2 Peak 12.32    ? Symptoms No    ? Resting HR 70 bpm    ? Resting BP 118/60    ? Resting Oxygen Saturation  93 %    ? Exercise Oxygen Saturation  during 6 min walk 86 %    ? Max Ex. HR 87 bpm    ? Max Ex. BP 150/70    ? 2 Minute Post BP 124/60    ?  ? Interval HR  ? 1 Minute HR 84    ? 2 Minute HR 86    ? 3 Minute HR  86    ? 4 Minute HR 87    ? 5 Minute HR 86    ? 6 Minute HR 87    ? 2 Minute Post HR 69    ? Interval Heart Rate? Yes    ?  ? Interval Oxygen  ? Interval Oxygen? Yes    ? Baseline Oxygen Saturation % 93 %    ? 1 Minute Oxygen Saturation % 89 %    ? 1 Minute Liters of Oxygen 0 L    ? 2 Minute Oxygen Saturation % 88 %    ? 2 Minute Liters of Oxygen 0 L    ? 3 Minute Oxygen Saturation % 87 %    ? 3 Minute Liters of Oxygen 0 L    ? 4 Minute Oxygen Saturation % 87 %    ? 4 Minute Liters of Oxygen 0 L    ? 5 Minute Oxygen Saturation % 86 %    ? 5 Minute Liters of Oxygen 0 L    ? 6 Minute Oxygen Saturation % 88 %    ? 6 Minute Liters of Oxygen 0 L    ? 2 Minute Post Oxygen Saturation % 91 %    ? 2 Minute Post Liters of Oxygen 0 L    ? ?  ?  ? ?  ? ? ?Psychological, QOL, Others - Outcomes: ?PHQ 2/9: ? ?  06/17/2021  ?  8:49 AM  ?Depression screen PHQ 2/9  ?Decreased Interest 1  ?Down, Depressed, Hopeless 0  ?PHQ - 2 Score 1  ?Altered sleeping 0  ?Tired, decreased energy 1  ?Change in appetite 1  ?Feeling bad or failure about yourself  0  ?Trouble concentrating 0  ?Moving slowly or fidgety/restless 0  ?Suicidal thoughts 0  ?PHQ-9 Score 3  ?Difficult doing work/chores Not difficult at all  ? ? ?Quality of Life: ? Quality of Life - 06/17/21 1016   ? ?  ? Quality of Life  ? Select Quality of Life   ?  ? Quality of Life Scores  ? Health/Function Pre 22.69 %   ? Socioeconomic Pre 30 %   ? Psych/Spiritual Pre 30 %   ? Family Pre 30 %   ? GLOBAL Pre 26.56 %   ? ?  ?  ? ?  ? ? ?Personal Goals: ?Goals established at orientation with interventions provided to work toward goal. ? Personal Goals  and Risk Factors at Admission - 06/17/21 0911   ? ?  ? Core Components/Risk Factors/Patient Goals on Admission  ? Improve shortness of breath with ADL's Yes   ? Intervention Provide education, individualized exercise plan and daily activity instruction to help decrease symptoms of SOB with activities of daily living.   ? Expected Outcomes Short Term: Improve cardiorespiratory fitness to achieve a reduction of symptoms when performing ADLs;Long Term: Be able to perform more ADLs without symptoms or delay the onset of symptoms   ? ?  ?  ? ?  ?  ? ?Personal Goals Discharge: ? ? ?Exercise Goals and Review: ? Exercise Goals   ? ? Row Name 06/17/21 1022  ?  ?  ?  ?  ?  ? Exercise Goals  ? Increase Physical Activity Yes      ? Intervention Provide advice, education, support and counseling about physical activity/exercise needs.;Develop an individualized exercise prescription for aerobic and resistive training based on initial evaluation findings, risk stratification, comorbidities and participant's personal goals.      ?  Expected Outcomes Short Term: Attend rehab on a regular basis to increase amount of physical activity.;Long Term: Add in home exercise to make exercise part of routine and to increase amount of physical activity.;Long Term: Exercising regularly at least 3-5 days a week.      ? Increase Strength and Stamina Yes      ? Intervention Provide advice, education, support and counseling about physical activity/exercise needs.;Develop an individualized exercise prescription for aerobic and resistive training based on initial evaluation findings, risk stratification, comorbidities and participant's personal goals.      ? Expected Outcomes Short Term: Increase workloads from initial exercise prescription for resistance, speed, and METs.;Short Term: Perform resistance training exercises routinely during rehab and add in resistance training at home;Long Term: Improve cardiorespiratory fitness, muscular endurance and  strength as measured by increased METs and functional capacity (6MWT)      ? Able to understand and use rate of perceived exertion (RPE) scale Yes      ? Intervention Provide education and explanation on how to use RPE scale      ? Expected Outcomes Short Term: Able to use RPE daily in rehab to express subjective intensity level;Long Term:  Able to use RPE to guide intensity level when exercising independently      ? Able to understand and use Dyspnea scale Yes      ? Intervention Provide education and explanation on how to use Dyspnea scale      ? Expected Outcomes Short Term: Able to use Dyspnea scale daily in rehab to express subjective sense of shortness of breath during exertion;Long Term: Able to use Dyspnea scale to guide intensity level when exercising independently      ? Knowledge and understanding of Target Heart Rate Range (THRR) Yes      ? Intervention Provide education and explanation of THRR including how the numbers were predicted and where they are located for reference      ? Expected Outcomes Short Term: Able to state/look up THRR;Long Term: Able to use THRR to govern intensity when exercising independently;Short Term: Able to use daily as guideline for intensity in rehab      ? Understanding of Exercise Prescription Yes      ? Intervention Provide education, explanation, and written materials on patient's individual exercise prescription      ? Expected Outcomes Short Term: Able to explain program exercise prescription;Long Term: Able to explain home exercise prescription to exercise independently      ? ?  ?  ? ?  ? ? ?Exercise Goals Re-Evaluation: ? ? ?Nutrition & Weight - Outcomes: ? Pre Biometrics - 06/17/21 1022   ? ?  ? Pre Biometrics  ? Height 5\' 7"  (1.702 m)   ? Weight 181 lb 10.5 oz (82.4 kg)   ? Waist Circumference 43 inches   ? Hip Circumference 37 inches   ? Waist to Hip Ratio 1.16 %   ? BMI (Calculated) 28.45   ? Triceps Skinfold 12 mm   ? % Body Fat 28.1 %   ? Grip Strength 42 kg    ? Flexibility 0 in   ? Single Leg Stand 9.89 seconds   ? ?  ?  ? ?  ? ? ? ?Nutrition: ? Nutrition Therapy & Goals - 06/17/21 0859   ? ?  ? Intervention Plan  ? Intervention Nutrition handout(s) given

## 2021-06-24 ENCOUNTER — Encounter (HOSPITAL_COMMUNITY): Payer: 59

## 2021-06-29 ENCOUNTER — Encounter (HOSPITAL_COMMUNITY): Payer: 59

## 2021-07-01 ENCOUNTER — Encounter (HOSPITAL_COMMUNITY): Payer: 59

## 2021-07-02 ENCOUNTER — Other Ambulatory Visit: Payer: Self-pay | Admitting: Pulmonary Disease

## 2021-07-06 ENCOUNTER — Encounter (HOSPITAL_COMMUNITY): Payer: 59

## 2021-07-08 ENCOUNTER — Encounter (HOSPITAL_COMMUNITY): Payer: 59

## 2021-07-13 ENCOUNTER — Encounter (HOSPITAL_COMMUNITY): Payer: 59

## 2021-07-15 ENCOUNTER — Encounter (HOSPITAL_COMMUNITY): Payer: 59

## 2021-07-20 ENCOUNTER — Encounter (HOSPITAL_COMMUNITY): Payer: 59

## 2021-07-22 ENCOUNTER — Encounter (HOSPITAL_COMMUNITY): Payer: 59

## 2021-07-27 ENCOUNTER — Encounter (HOSPITAL_COMMUNITY): Payer: 59

## 2021-07-29 ENCOUNTER — Encounter (HOSPITAL_COMMUNITY): Payer: 59

## 2021-08-03 ENCOUNTER — Encounter (HOSPITAL_COMMUNITY): Payer: 59

## 2021-08-05 ENCOUNTER — Encounter (HOSPITAL_COMMUNITY): Payer: 59

## 2021-08-10 ENCOUNTER — Encounter (HOSPITAL_COMMUNITY): Payer: 59

## 2021-08-12 ENCOUNTER — Encounter (HOSPITAL_COMMUNITY): Payer: 59

## 2021-08-17 ENCOUNTER — Encounter (HOSPITAL_COMMUNITY): Payer: 59

## 2021-08-19 ENCOUNTER — Encounter (HOSPITAL_COMMUNITY): Payer: 59

## 2021-08-24 ENCOUNTER — Encounter (HOSPITAL_COMMUNITY): Payer: 59

## 2021-08-26 ENCOUNTER — Encounter (HOSPITAL_COMMUNITY): Payer: 59

## 2021-08-31 ENCOUNTER — Encounter (HOSPITAL_COMMUNITY): Payer: 59

## 2021-09-02 ENCOUNTER — Encounter (HOSPITAL_COMMUNITY): Payer: 59

## 2021-09-06 ENCOUNTER — Telehealth: Payer: Self-pay | Admitting: Pulmonary Disease

## 2021-09-06 DIAGNOSIS — G4734 Idiopathic sleep related nonobstructive alveolar hypoventilation: Secondary | ICD-10-CM

## 2021-09-07 ENCOUNTER — Encounter (HOSPITAL_COMMUNITY): Payer: 59

## 2021-09-08 NOTE — Telephone Encounter (Signed)
Ok to place order for travel concentrator

## 2021-09-09 ENCOUNTER — Encounter (HOSPITAL_COMMUNITY): Payer: 59

## 2021-09-09 NOTE — Telephone Encounter (Signed)
Called and spoke with Renee. She is aware that the order will be placed and sent to [facility].   Nothing further needed at time of call.

## 2021-09-14 ENCOUNTER — Encounter (HOSPITAL_COMMUNITY): Payer: 59

## 2021-09-15 ENCOUNTER — Telehealth: Payer: Self-pay | Admitting: Pulmonary Disease

## 2021-09-16 ENCOUNTER — Encounter (HOSPITAL_COMMUNITY): Payer: 59

## 2021-09-16 NOTE — Telephone Encounter (Signed)
Left message for patient to call back. Will need to get him scheduled for an OV and walk.

## 2021-09-21 ENCOUNTER — Encounter (HOSPITAL_COMMUNITY): Payer: 59

## 2021-09-23 ENCOUNTER — Encounter (HOSPITAL_COMMUNITY): Payer: 59

## 2021-09-28 ENCOUNTER — Encounter (HOSPITAL_COMMUNITY): Payer: 59

## 2021-09-30 ENCOUNTER — Encounter (HOSPITAL_COMMUNITY): Payer: 59

## 2021-10-05 ENCOUNTER — Encounter (HOSPITAL_COMMUNITY): Payer: 59

## 2021-10-07 ENCOUNTER — Encounter (HOSPITAL_COMMUNITY): Payer: 59

## 2021-10-12 ENCOUNTER — Encounter (HOSPITAL_COMMUNITY): Payer: 59

## 2021-10-14 ENCOUNTER — Encounter (HOSPITAL_COMMUNITY): Payer: 59

## 2021-10-19 ENCOUNTER — Encounter (HOSPITAL_COMMUNITY): Payer: 59

## 2021-10-21 ENCOUNTER — Encounter (HOSPITAL_COMMUNITY): Payer: 59

## 2021-10-29 ENCOUNTER — Other Ambulatory Visit: Payer: Self-pay | Admitting: Pulmonary Disease

## 2021-11-19 ENCOUNTER — Other Ambulatory Visit (HOSPITAL_BASED_OUTPATIENT_CLINIC_OR_DEPARTMENT_OTHER): Payer: Self-pay

## 2021-11-19 DIAGNOSIS — R0683 Snoring: Secondary | ICD-10-CM

## 2021-11-19 DIAGNOSIS — R5383 Other fatigue: Secondary | ICD-10-CM

## 2021-11-19 DIAGNOSIS — R0681 Apnea, not elsewhere classified: Secondary | ICD-10-CM

## 2021-11-19 DIAGNOSIS — G4710 Hypersomnia, unspecified: Secondary | ICD-10-CM

## 2021-11-27 ENCOUNTER — Other Ambulatory Visit: Payer: Self-pay | Admitting: Pulmonary Disease

## 2022-01-28 ENCOUNTER — Ambulatory Visit: Payer: 59 | Admitting: Primary Care

## 2022-01-28 ENCOUNTER — Ambulatory Visit (INDEPENDENT_AMBULATORY_CARE_PROVIDER_SITE_OTHER): Payer: 59

## 2022-01-28 ENCOUNTER — Encounter: Payer: Self-pay | Admitting: Primary Care

## 2022-01-28 VITALS — BP 124/68 | HR 67 | Temp 99.0°F | Ht 67.0 in | Wt 182.8 lb

## 2022-01-28 DIAGNOSIS — J439 Emphysema, unspecified: Secondary | ICD-10-CM

## 2022-01-28 DIAGNOSIS — G4734 Idiopathic sleep related nonobstructive alveolar hypoventilation: Secondary | ICD-10-CM

## 2022-01-28 DIAGNOSIS — G479 Sleep disorder, unspecified: Secondary | ICD-10-CM

## 2022-01-28 DIAGNOSIS — R413 Other amnesia: Secondary | ICD-10-CM

## 2022-01-28 DIAGNOSIS — J449 Chronic obstructive pulmonary disease, unspecified: Secondary | ICD-10-CM

## 2022-01-28 DIAGNOSIS — J4489 Other specified chronic obstructive pulmonary disease: Secondary | ICD-10-CM

## 2022-01-28 LAB — BASIC METABOLIC PANEL
BUN: 13 mg/dL (ref 6–23)
CO2: 34 mEq/L — ABNORMAL HIGH (ref 19–32)
Calcium: 9.2 mg/dL (ref 8.4–10.5)
Chloride: 97 mEq/L (ref 96–112)
Creatinine, Ser: 0.74 mg/dL (ref 0.40–1.50)
GFR: 100.72 mL/min (ref >60.00)
Glucose, Bld: 113 mg/dL — ABNORMAL HIGH (ref 70–99)
Potassium: 3.9 mEq/L (ref 3.5–5.1)
Sodium: 136 mEq/L (ref 135–145)

## 2022-01-28 MED ORDER — DOXYCYCLINE HYCLATE 100 MG PO TABS: 100.0000 mg | ORAL_TABLET | Freq: Two times a day (BID) | ORAL | 0 refills | Status: DC

## 2022-01-28 MED ORDER — PREDNISONE 10 MG PO TABS
ORAL_TABLET | ORAL | 0 refills | Status: DC
Start: 2022-01-28 — End: 2022-07-21

## 2022-01-28 NOTE — Progress Notes (Signed)
CXR showed evidence of bronchitis, I have already sent in abx. If not better let me know. Keep apt in Jan with Dr. Isaiah Serge

## 2022-01-28 NOTE — Progress Notes (Unsigned)
@Patient  ID: ., male    DOB: 1965-10-18, 56 y.o.   MRN: [id number]  No chief complaint on file.   Referring provider: [id number], MD  HPI: 56 year old male, former smoker quit in 2019.  Past medical history significant for COPD mixed type, nocturnal hypoxia.  Patient of Dr. 2020, last seen on 03/24/2021.   01/28/2022- Interim hx  Patient presents today for overdue follow-up.  He has history of severe COPD maintained on trilogy Ellipta.  During his last office visit he was referred back to pulmonary rehab in [place] ordered for updated PFTs.  CT chest in March 2022 showed stable COPD findings and lung nodule.  Breathing is worse. He is on April 2022. Using rescue inhaler  He has a congested cough with green mucus. Wheezing at night. He is taking mucinex.  He works as [employer]. Needs more help at work. Having memory issues since Nov 2022. He made an error at work and is forgetting more things. He has been demoted and pay has been decreased. Wife is concerned because he drive 2 hours to work. He has an apt with neurology next week. Family hx of maternal grandfather died of alzheimer's.   He is tired/fatigue. He makes noises while he sleeps. He goes to bed early. Typical bedtime 9pm. He wakes up a couple of times. He starts his day at 5am. No sedatives or alcohol. No symptoms of narcolepsy, cataplexy or sleep walking. He wear 3.5L oxygen at bedtime.   Imaging LDCT 06/02/20 >> Mild centrilobular and paraseptal emphysematous changed, upper lung predominant. Mild lingular scarring. Small mediastinal lymph nodules which do not meet pathologic CT size criteria.    No Known Allergies  Immunization History  Administered Date(s) Administered   Influenza Whole 01/28/2020   Influenza,inj,Quad PF,6+ Mos 12/28/2015, 12/26/2016, 12/27/2017   Influenza-Unspecified 11/12/2020    Past Medical History:  Diagnosis Date   Asthma    COPD (chronic obstructive  pulmonary disease) (HCC)    Nocturnal hypoxemia     Tobacco History: Social History   Tobacco Use  Smoking Status Former   Packs/day: 1.00   Years: 35.00   Total pack years: 35.00   Types: Cigarettes   Quit date: 06/12/2017   Years since quitting: 4.6  Smokeless Tobacco Never   Counseling given: Not Answered   Outpatient Medications Prior to Visit  Medication Sig Dispense Refill   albuterol (VENTOLIN HFA) 108 (90 Base) MCG/ACT inhaler Inhale 2 puffs into the lungs every 4 (four) hours as needed.     benzonatate (TESSALON) 200 MG capsule Take 200 mg by mouth daily as needed for cough.     Budeson-Glycopyrrol-Formoterol (BREZTRI AEROSPHERE) 160-9-4.8 MCG/ACT AERO INHALE 2 PUFFS INTO THE LUNGS 2 TIMES DAILY IN THE MORNING AND EVENING. 10.7 g 5   celecoxib (CELEBREX) 200 MG capsule Take 200 mg by mouth daily.     CINNAMON PO Take 1 tablet by mouth daily.     cyclobenzaprine (FLEXERIL) 10 MG tablet Take 10 mg by mouth every 8 (eight) hours as needed.     Ginger, Zingiber officinalis, (GINGER PO) Take 1 tablet by mouth daily.     Ipratropium-Albuterol (COMBIVENT RESPIMAT) 20-100 MCG/ACT AERS respimat INHALE 1 PUFF EVERY 6 HOURS 4 g 5   ipratropium-albuterol (DUONEB) 0.5-2.5 (3) MG/3ML SOLN Take 3 mLs by nebulization in the morning, at noon, in the evening, and at bedtime. (Patient taking differently: Take 3 mLs by nebulization in the morning and at bedtime.) 360 mL 1  levalbuterol (XOPENEX HFA) 45 MCG/ACT inhaler Inhale 1 puff into the lungs every 4 (four) hours as needed for shortness of breath.     MAGNESIUM PO Take 1 tablet by mouth daily.     meloxicam (MOBIC) 15 MG tablet Take 7.5 mg by mouth daily.     Multiple Vitamins-Minerals (ZINC PO) Take 1 tablet by mouth daily.     Omega-3 Fatty Acids (FISH OIL PO) Take 1 tablet by mouth daily.     TURMERIC PO Take 1 tablet by mouth daily.     No facility-administered medications prior to visit.   Review of Systems  Review of Systems   Constitutional:  Positive for fatigue.  HENT:  Positive for congestion.   Respiratory:  Positive for cough, shortness of breath and wheezing.   Psychiatric/Behavioral:  Positive for decreased concentration.    Physical Exam  There were no vitals taken for this visit. Physical Exam Constitutional:      Appearance: Normal appearance.  HENT:     Head: Normocephalic and atraumatic.     Mouth/Throat:     Mouth: Mucous membranes are moist.     Pharynx: Oropharynx is clear.  Cardiovascular:     Rate and Rhythm: Normal rate and regular rhythm.  Pulmonary:     Breath sounds: Wheezing and rhonchi present.  Musculoskeletal:        General: Normal range of motion.  Skin:    General: Skin is warm and dry.  Neurological:     General: No focal deficit present.     Mental Status: He is alert and oriented to person, place, and time. Mental status is at baseline.     Comments: Neuro's grossly intact  Psychiatric:        Mood and Affect: Mood normal.        Behavior: Behavior normal.        Judgment: Judgment normal.     Comments: Wife answering most questions d/t memory issues       Lab Results:  CBC    Component Value Date/Time   WBC 9.2 11/26/2019 0930   RBC 4.23 11/26/2019 0930   HGB 13.2 11/26/2019 0930   HCT 40.3 11/26/2019 0930   PLT 272.0 11/26/2019 0930   MCV 95.3 11/26/2019 0930   MCHC 32.8 11/26/2019 0930   RDW 14.4 11/26/2019 0930   LYMPHSABS 1.9 11/26/2019 0930   MONOABS 0.6 11/26/2019 0930   EOSABS 0.2 11/26/2019 0930   BASOSABS 0.1 11/26/2019 0930    BMET    Component Value Date/Time   NA 139 09/21/2016 1145   K 4.5 09/21/2016 1145   CL 99 09/21/2016 1145   CO2 33 (H) 09/21/2016 1145   GLUCOSE 113 (H) 09/21/2016 1145   BUN 10 09/21/2016 1145   CREATININE 0.76 09/21/2016 1145   CALCIUM 9.4 09/21/2016 1145    BNP No results found for: "BNP"  ProBNP No results found for: "PROBNP"  Imaging: No results found.   Assessment & Plan:   No  problem-specific Assessment & Plan notes found for this encounter.     Glenford Bayley, NP 01/28/2022

## 2022-01-28 NOTE — Patient Instructions (Addendum)
Recommendations: Continue Breztri Aerosphere - take two puff morning and evening Continue Combivent - one puff four times a day as needed for shortness of breath/wheezing  Continue 3.5L oxygen at bedtime Start antibiotic and prednisone for bronchitis symptoms  Discuss imaging brain with neurologist   Orders: CXR today (ordered) Labs today (ordered) In-lab sleep study (ordered)  Rx: Doxycyline 100mg  BID x 7 days  Prednisone taper as directed   Follow-up: 2-3 months with Dr. 

## 2022-01-30 DIAGNOSIS — R413 Other amnesia: Secondary | ICD-10-CM | POA: Insufficient documentation

## 2022-01-30 NOTE — Assessment & Plan Note (Addendum)
-   Concern for sleep apnea, needs in-lab sleep study to assess for OSA and titrate O2. Continue 3-3.5L oxygen at bedtime. May be able to decrease supplemental o2 at night.

## 2022-01-30 NOTE — Assessment & Plan Note (Signed)
-   Acute exacerbation/worsening dyspnea. Continue Breztri Aerosphere 2 puffs BID and Combivent one puff four times a day as needed for shortness of breath/wheezing. Sending in Doxycycline 100mg  BID x 7 days and prednisone taper for bronchitis symptoms.

## 2022-01-30 NOTE — Assessment & Plan Note (Addendum)
-    Neuro's grossly intact. His ambulatory walk test in office showed no oxygen desaturations. Memory issues not felt to be related to COPD. He has a family hx Alzheimer's. No imaging done yet. Following with neurology, has an apt next week.

## 2022-02-02 ENCOUNTER — Other Ambulatory Visit (HOSPITAL_COMMUNITY): Payer: Self-pay | Admitting: Neurology

## 2022-02-02 DIAGNOSIS — G319 Degenerative disease of nervous system, unspecified: Secondary | ICD-10-CM

## 2022-02-11 ENCOUNTER — Ambulatory Visit (HOSPITAL_COMMUNITY)
Admission: RE | Admit: 2022-02-11 | Discharge: 2022-02-11 | Disposition: A | Discharge status: Home / Self Care | Payer: 59 | Source: Ambulatory Visit | Attending: Neurology | Admitting: Neurology

## 2022-02-11 DIAGNOSIS — G319 Degenerative disease of nervous system, unspecified: Secondary | ICD-10-CM | POA: Diagnosis not present

## 2022-03-25 ENCOUNTER — Ambulatory Visit: Payer: 59 | Admitting: Pulmonary Disease

## 2022-04-20 ENCOUNTER — Telehealth: Payer: Self-pay | Admitting: Pulmonary Disease

## 2022-04-20 NOTE — Telephone Encounter (Signed)
Okay to give Breztri samples.

## 2022-04-20 NOTE — Telephone Encounter (Signed)
Called and spoke to wife and let her know she can pick up samples at office when she has time. She states she will be by the office tomorrow or Friday. Nothing further needed

## 2022-04-20 NOTE — Telephone Encounter (Signed)
ATC X1 LVM for pt to call the office back 

## 2022-04-20 NOTE — Telephone Encounter (Signed)
Spoke with patients wife. She has called trying to get samples for Breztri, Albuterol, and Combivent. I advised we only had Breztri. She states patient has lost his job in December and currently does not have insurance. She also states patient has been diagnosed with early dementia and alzheimer's. She believes this is making his breathing worse. She states he is very anxious and scared now. She states that have applied for disability but unsure when patient would receive that. Can we give him Breztri samples. We can send him patient assistance paperwork for the rest. Dr. Vaughan Browner please advise

## 2022-04-21 ENCOUNTER — Ambulatory Visit: Payer: 59 | Admitting: Nurse Practitioner

## 2022-05-20 DIAGNOSIS — G309 Alzheimer's disease, unspecified: Secondary | ICD-10-CM | POA: Diagnosis not present

## 2022-05-20 DIAGNOSIS — J449 Chronic obstructive pulmonary disease, unspecified: Secondary | ICD-10-CM | POA: Diagnosis not present

## 2022-05-20 DIAGNOSIS — T50905A Adverse effect of unspecified drugs, medicaments and biological substances, initial encounter: Secondary | ICD-10-CM | POA: Diagnosis not present

## 2022-05-20 DIAGNOSIS — I700 Atherosclerosis of aorta: Secondary | ICD-10-CM | POA: Diagnosis not present

## 2022-06-01 ENCOUNTER — Ambulatory Visit (HOSPITAL_COMMUNITY)
Admission: RE | Admit: 2022-06-01 | Discharge: 2022-06-01 | Disposition: A | Discharge status: Home / Self Care | Payer: 59 | Source: Ambulatory Visit | Attending: Internal Medicine | Admitting: Internal Medicine

## 2022-06-01 ENCOUNTER — Other Ambulatory Visit (HOSPITAL_COMMUNITY): Payer: Self-pay | Admitting: Internal Medicine

## 2022-06-01 DIAGNOSIS — E663 Overweight: Secondary | ICD-10-CM | POA: Diagnosis not present

## 2022-06-01 DIAGNOSIS — R059 Cough, unspecified: Secondary | ICD-10-CM | POA: Diagnosis not present

## 2022-06-01 DIAGNOSIS — J449 Chronic obstructive pulmonary disease, unspecified: Secondary | ICD-10-CM | POA: Diagnosis not present

## 2022-06-01 DIAGNOSIS — G309 Alzheimer's disease, unspecified: Secondary | ICD-10-CM | POA: Diagnosis not present

## 2022-06-01 DIAGNOSIS — Z6829 Body mass index (BMI) 29.0-29.9, adult: Secondary | ICD-10-CM | POA: Diagnosis not present

## 2022-06-01 DIAGNOSIS — J441 Chronic obstructive pulmonary disease with (acute) exacerbation: Secondary | ICD-10-CM | POA: Diagnosis not present

## 2022-06-01 DIAGNOSIS — R0602 Shortness of breath: Secondary | ICD-10-CM | POA: Diagnosis not present

## 2022-06-01 DIAGNOSIS — I700 Atherosclerosis of aorta: Secondary | ICD-10-CM | POA: Diagnosis not present

## 2022-06-01 DIAGNOSIS — R079 Chest pain, unspecified: Secondary | ICD-10-CM | POA: Diagnosis not present

## 2022-06-07 ENCOUNTER — Other Ambulatory Visit: Payer: Self-pay | Admitting: Pulmonary Disease

## 2022-06-07 ENCOUNTER — Ambulatory Visit: Payer: 59 | Admitting: Adult Health

## 2022-06-07 ENCOUNTER — Encounter: Payer: Self-pay | Admitting: Adult Health

## 2022-06-07 VITALS — BP 142/82 | HR 100 | Temp 97.7°F | Ht 67.0 in | Wt 189.0 lb

## 2022-06-07 DIAGNOSIS — J9611 Chronic respiratory failure with hypoxia: Secondary | ICD-10-CM | POA: Diagnosis not present

## 2022-06-07 DIAGNOSIS — J4489 Other specified chronic obstructive pulmonary disease: Secondary | ICD-10-CM | POA: Diagnosis not present

## 2022-06-07 DIAGNOSIS — F17210 Nicotine dependence, cigarettes, uncomplicated: Secondary | ICD-10-CM | POA: Diagnosis not present

## 2022-06-07 MED ORDER — PREDNISONE 10 MG PO TABS: ORAL_TABLET | ORAL | 0 refills | Status: DC

## 2022-06-07 MED ORDER — BENZONATATE 200 MG PO CAPS: 200.0000 mg | ORAL_CAPSULE | Freq: Every day | ORAL | 3 refills | Status: DC | PRN

## 2022-06-07 NOTE — Progress Notes (Unsigned)
@Patient  ID: Jeff Cruz., male    DOB: 1965-11-26, 57 y.o.   MRN: [id number]  Chief Complaint  Patient presents with   Follow-up    Referring provider: Sharilyn Sites, MD  HPI: 57 year old male former smoker followed for COPD and chronic respiratory failure Medical history significant for early onset dementia  TEST/EVENTS :  PFTs May 13, 2021 showed FEV1 at 26%, ratio 39, FVC 52%, no significant bronchodilator response, DLCO 60%.  LDCT 06/02/20 >> Mild centrilobular and paraseptal emphysematous changed, upper lung predominant. Mild lingular scarring. Small mediastinal lymph nodules which do not meet pathologic CT size criteria.    06/07/2022 Acute OV : COPD , O2 RF  Patient presents for an acute office visit.  Patient complains of increased cough, shortness of breath and intermittent wheezing over the last 4 weeks.  Says he had COVID-19 around a month ago.  He was treated for an acute bronchitis with antibiotics and steroids.  Chest x-ray on March 20 showed clear lungs.  Says that his symptoms have improved but he continues to have some ongoing cough and intermittent wheezing.  Patient is accompanied by his wife.  He has been diagnosed with early onset dementia.  Unfortunately he has lost his job due to this. Patient has been getting short of breath with activities.  He is on nocturnal oxygen at 3.5 L.  Patient says his oxygen levels have been dropping some during the daytime.  Patient has previously used oxygen during the daytime but does not have any portable system.  Walk test today in the office shows O2 saturations with ambulation at 85% on room air.  Required 3 L of pulsed oxygen on POC device to maintain O2 saturations greater than 88%.  At rest O2 saturations at 91% on room air.  Patient is currently on Breztri twice daily.  Uses Combivent or his DuoNeb as needed.  He does participate in the lung cancer CT screening program.  He is overdue for his CT screening he missed last  year.  He denies any hemoptysis, chest pain, orthopnea, discolored mucus or fever.  No calf pain or swelling.  Appetite is good.  Has been using Tessalon Perles for cough.  Request refill  No Known Allergies  Immunization History  Administered Date(s) Administered   Influenza Whole 01/28/2020   Influenza,inj,Quad PF,6+ Mos 12/28/2015, 12/26/2016, 12/27/2017   Influenza-Unspecified 11/12/2020    Past Medical History:  Diagnosis Date   Asthma    COPD (chronic obstructive pulmonary disease) (HCC)    Nocturnal hypoxemia     Tobacco History: Social History   Tobacco Use  Smoking Status Former   Packs/day: 1.00   Years: 35.00   Additional pack years: 0.00   Total pack years: 35.00   Types: Cigarettes   Quit date: 06/12/2017   Years since quitting: 4.9  Smokeless Tobacco Never   Counseling given: Not Answered   Outpatient Medications Prior to Visit  Medication Sig Dispense Refill   albuterol (VENTOLIN HFA) 108 (90 Base) MCG/ACT inhaler Inhale 2 puffs into the lungs every 4 (four) hours as needed.     benzonatate (TESSALON) 200 MG capsule Take 200 mg by mouth daily as needed for cough.     Budeson-Glycopyrrol-Formoterol (BREZTRI AEROSPHERE) 160-9-4.8 MCG/ACT AERO INHALE 2 PUFFS INTO THE LUNGS 2 TIMES DAILY IN THE MORNING AND EVENING. 10.7 g 5   celecoxib (CELEBREX) 200 MG capsule Take 200 mg by mouth daily.     CINNAMON PO Take 1 tablet by mouth  daily.     cyclobenzaprine (FLEXERIL) 10 MG tablet Take 10 mg by mouth every 8 (eight) hours as needed.     doxycycline (VIBRA-TABS) 100 MG tablet Take 1 tablet (100 mg total) by mouth 2 (two) times daily. 14 tablet 0   Ginger, Zingiber officinalis, (GINGER PO) Take 1 tablet by mouth daily.     Ipratropium-Albuterol (COMBIVENT RESPIMAT) 20-100 MCG/ACT AERS respimat INHALE 1 PUFF EVERY 6 HOURS 4 g 5   ipratropium-albuterol (DUONEB) 0.5-2.5 (3) MG/3ML SOLN Take 3 mLs by nebulization in the morning, at noon, in the evening, and at bedtime.  (Patient taking differently: Take 3 mLs by nebulization in the morning and at bedtime.) 360 mL 1   levalbuterol (XOPENEX HFA) 45 MCG/ACT inhaler Inhale 1 puff into the lungs every 4 (four) hours as needed for shortness of breath.     MAGNESIUM PO Take 1 tablet by mouth daily.     meloxicam (MOBIC) 15 MG tablet Take 7.5 mg by mouth daily.     Multiple Vitamins-Minerals (ZINC PO) Take 1 tablet by mouth daily.     Omega-3 Fatty Acids (FISH OIL PO) Take 1 tablet by mouth daily.     TURMERIC PO Take 1 tablet by mouth daily.     predniSONE (DELTASONE) 10 MG tablet 4 tabs for 2 days, then 3 tabs for 2 days, 2 tabs for 2 days, then 1 tab for 2 days, then stop (Patient not taking: Reported on 06/07/2022) 20 tablet 0   No facility-administered medications prior to visit.     Review of Systems:   Constitutional:   No  weight loss, night sweats,  Fevers, chills, fatigue, or  lassitude.  HEENT:   No headaches,  Difficulty swallowing,  Tooth/dental problems, or  Sore throat,                No sneezing, itching, ear ache, nasal congestion, post nasal drip,   CV:  No chest pain,  Orthopnea, PND, swelling in lower extremities, anasarca, dizziness, palpitations, syncope.   GI  No heartburn, indigestion, abdominal pain, nausea, vomiting, diarrhea, change in bowel habits, loss of appetite, bloody stools.   Resp: No shortness of breath with exertion or at rest.  No excess mucus, no productive cough,  No non-productive cough,  No coughing up of blood.  No change in color of mucus.  No wheezing.  No chest wall deformity  Skin: no rash or lesions.  GU: no dysuria, change in color of urine, no urgency or frequency.  No flank pain, no hematuria   MS:  No joint pain or swelling.  No decreased range of motion.  No back pain.    Physical Exam  BP (!) 142/82   Pulse 100   Temp 97.7 F (36.5 C) (Oral)   Ht 5\' 7"  (1.702 m)   Wt 189 lb (85.7 kg)   SpO2 93%   BMI 29.60 kg/m   GEN: A/Ox3; pleasant , NAD,  well nourished    HEENT:  [place]/AT,  EACs-clear, TMs-wnl, NOSE-clear, THROAT-clear, no lesions, no postnasal drip or exudate noted.   NECK:  Supple w/ fair ROM; no JVD; normal carotid impulses w/o bruits; no thyromegaly or nodules palpated; no lymphadenopathy.    RESP  Clear  P & A; w/o, wheezes/ rales/ or rhonchi. no accessory muscle use, no dullness to percussion  CARD:  RRR, no m/r/g, no peripheral edema, pulses intact, no cyanosis or clubbing.  GI:   Soft & nt; nml bowel sounds; no  organomegaly or masses detected.   Musco: Warm bil, no deformities or joint swelling noted.   Neuro: alert, no focal deficits noted.    Skin: Warm, no lesions or rashes    Lab Results:  CBC    Component Value Date/Time   WBC 9.2 11/26/2019 0930   RBC 4.23 11/26/2019 0930   HGB 13.2 11/26/2019 0930   HCT 40.3 11/26/2019 0930   PLT 272.0 11/26/2019 0930   MCV 95.3 11/26/2019 0930   MCHC 32.8 11/26/2019 0930   RDW 14.4 11/26/2019 0930   LYMPHSABS 1.9 11/26/2019 0930   MONOABS 0.6 11/26/2019 0930   EOSABS 0.2 11/26/2019 0930   BASOSABS 0.1 11/26/2019 0930    BMET    Component Value Date/Time   NA 136 01/28/2022 1030   K 3.9 01/28/2022 1030   CL 97 01/28/2022 1030   CO2 34 (H) 01/28/2022 1030   GLUCOSE 113 (H) 01/28/2022 1030   BUN 13 01/28/2022 1030   CREATININE 0.74 01/28/2022 1030   CALCIUM 9.2 01/28/2022 1030    BNP No results found for: "BNP"  ProBNP No results found for: "PROBNP"  Imaging: DG Chest 2 View  Result Date: 06/01/2022 CLINICAL DATA:  Cough for 1 week, some chest pain and shortness of breath, history COPD EXAM: CHEST - 2 VIEW COMPARISON:  01/28/2022 FINDINGS: Upper normal heart size. Mediastinal contours and pulmonary vascularity normal. Lungs slightly hyperinflated but clear. No pulmonary infiltrate, pleural effusion, or pneumothorax. Mild osseous demineralization with degenerative changes at caudal aspect of thoracic spine. IMPRESSION: No acute abnormalities.  Electronically Signed   By: Lavonia Dana M.D.   On: 06/01/2022 16:40         Latest Ref Rng & Units 05/13/2021    8:57 AM  PFT Results  FVC-Pre L 1.98   FVC-Predicted Pre % 46   FVC-Post L 2.23   FVC-Predicted Post % 52   Pre FEV1/FVC % % 41   Post FEV1/FCV % % 39   FEV1-Pre L 0.81   FEV1-Predicted Pre % 24   FEV1-Post L 0.88   DLCO uncorrected ml/min/mmHg 15.08   DLCO UNC% % 60   DLVA Predicted % 100   TLC L 4.75   TLC % Predicted % 76   RV % Predicted % 132     No results found for: "NITRICOXIDE"      Assessment & Plan:   No problem-specific Assessment & Plan notes found for this encounter.     Rexene Edison, NP 06/07/2022

## 2022-06-07 NOTE — Patient Instructions (Addendum)
Begin Prednisone taper over next week.  Continue Breztri Aerosphere - take two puff morning and evening Continue Combivent - one puff four times a day as needed for shortness of breath/wheezing or Duoneb As needed   Continue 3.5L oxygen at bedtime Begin Oxygen 3l/m with activity  Order for POC  Continue on with Lung cancer CT chest screening .  Follow up with Dr. Vaughan Browner or Aria Jarrard NP  in 2 weeks and As needed   Please contact office for sooner follow up if symptoms do not improve or worsen or seek emergency care

## 2022-06-09 DIAGNOSIS — J9611 Chronic respiratory failure with hypoxia: Secondary | ICD-10-CM | POA: Insufficient documentation

## 2022-06-09 NOTE — Assessment & Plan Note (Signed)
Patient is to continue on nocturnal oxygen.  Now with exertional desaturations.  Will need more portable system to help with this.  Patient is able to maintain O2 saturations on POC device at 3 L.  Order for POC.  Patient's begin oxygen with activity.  Plan  Patient Instructions  Begin Prednisone taper over next week.  Continue Breztri Aerosphere - take two puff morning and evening Continue Combivent - one puff four times a day as needed for shortness of breath/wheezing or Duoneb As needed   Continue 3.5L oxygen at bedtime Begin Oxygen 3l/m with activity  Order for POC  Continue on with Lung cancer CT chest screening .  Follow up with Dr. Vaughan Browner or Tashonda Pinkus NP  in 2 weeks and As needed   Please contact office for sooner follow up if symptoms do not improve or worsen or seek emergency care

## 2022-06-09 NOTE — Assessment & Plan Note (Signed)
Slow to resolve COPD flare after recent COVID-19.  Will treat with a prednisone taper.  Continue on triple therapy maintenance regimen.  May use Combivent or DuoNeb as needed.  Refer back to the lung cancer CT screening program.  Plan  Patient Instructions  Begin Prednisone taper over next week.  Continue Breztri Aerosphere - take two puff morning and evening Continue Combivent - one puff four times a day as needed for shortness of breath/wheezing or Duoneb As needed   Continue 3.5L oxygen at bedtime Begin Oxygen 3l/m with activity  Order for POC  Continue on with Lung cancer CT chest screening .  Follow up with Dr. Vaughan Browner or Shaye Elling NP  in 2 weeks and As needed   Please contact office for sooner follow up if symptoms do not improve or worsen or seek emergency care   '

## 2022-06-15 ENCOUNTER — Encounter: Payer: Self-pay | Admitting: Adult Health

## 2022-06-17 ENCOUNTER — Telehealth: Payer: Self-pay

## 2022-06-17 NOTE — Telephone Encounter (Signed)
PA request received via CMM for Combivent Respimat 20-100MCG/ACT aerosol  PA has been submitted to [employer] and is pending determination  Key: [id number]

## 2022-06-20 NOTE — Telephone Encounter (Signed)
PA has been DENIED due to:   Coverage for this medication is denied for the following reason(s). We reviewed the information we received about your condition and circumstances. We used the plan approved policy when making this decision. The policy states that this medication may be approved when: -The member has a clinical condition or needs a specific dosage form for which there is no alternative on the formulary OR -The listed formulary alternatives are not recommended based on published guidelines or clinical literature OR -The formulary alternatives will likely be ineffective or less effective for the member OR -The formulary alternatives will likely cause an adverse effect OR -The member is unable to take the required number of formulary alternatives for the given diagnosis due to a trial and inadequate treatment response or contraindication OR -The member has tried and failed the required number of formulary alternatives. Based on the policy and the information we have, your request is denied. We did not receive any documentation that you meet any of the criteria outlined above. Formulary alternative(s) are Spiriva Respimat, ipratropium bromide/albut, tiotropium bromide. Requirement: 3 in a class with 3 or more alternatives, 2 in a class with 2 alternatives, or 1 in a class with only 1 alternative. Please refer to your plan documents for a complete list of alternatives. Note: Formulary alternatives may require a prior authorization. Your prescriber will be responsible for determining what alternative is appropriate for you

## 2022-06-21 ENCOUNTER — Ambulatory Visit
Admission: RE | Admit: 2022-06-21 | Discharge: 2022-06-21 | Disposition: A | Discharge status: Home / Self Care | Payer: 59 | Source: Ambulatory Visit | Attending: Adult Health | Admitting: Adult Health

## 2022-06-21 ENCOUNTER — Telehealth: Payer: Self-pay

## 2022-06-21 DIAGNOSIS — J432 Centrilobular emphysema: Secondary | ICD-10-CM | POA: Diagnosis not present

## 2022-06-21 DIAGNOSIS — Z87891 Personal history of nicotine dependence: Secondary | ICD-10-CM | POA: Diagnosis not present

## 2022-06-21 DIAGNOSIS — Z122 Encounter for screening for malignant neoplasm of respiratory organs: Secondary | ICD-10-CM | POA: Diagnosis not present

## 2022-06-21 DIAGNOSIS — F17210 Nicotine dependence, cigarettes, uncomplicated: Secondary | ICD-10-CM

## 2022-06-21 DIAGNOSIS — I2510 Atherosclerotic heart disease of native coronary artery without angina pectoris: Secondary | ICD-10-CM | POA: Diagnosis not present

## 2022-06-21 NOTE — Telephone Encounter (Signed)
Routing to Dr. Isaiah Serge for review. Please advise on the prior auth that was recently done and what you recommend.

## 2022-06-21 NOTE — Telephone Encounter (Signed)
PA request received via CMM for Breztri Aerosphere 160-9-4.8MCG/ACT aerosol  PA has been submitted to [employer] and is pending determination  Key: [id number]

## 2022-06-22 NOTE — Telephone Encounter (Signed)
PA has been DENIED due to:   Coverage for this medication is denied for the following reason(s). We reviewed the information we received about your condition and circumstances. We used the plan approved policy when making this decision. The policy states that this medication may be approved when: -The member has a clinical condition or needs a specific dosage form for which there is no alternative on the formulary OR -The listed formulary alternatives are not recommended based on published guidelines or clinical literature OR -The formulary alternatives will likely be ineffective or less effective for the member OR -The formulary alternatives will likely cause an adverse effect OR -The member is unable to take the required number of formulary alternatives for the given diagnosis due to a trial and inadequate treatment response or contraindication OR -The member has tried and failed the required number of formulary alternatives. Based on the policy and the information we have, your request is denied. We did not receive any documentation that you meet any of the criteria outlined above. Formulary alternative(s) are [employer], Anoro Ellipta, Trelegy Ellipta. Requirement: 3 in a class with 3 or more alternatives, 2 in a class with 2 alternatives, or 1 in a class with only 1 alternative. Please refer to your plan documents for a complete list of alternatives. Note: Formulary alternatives may require a prior authorization. Your prescriber will be responsible for determining what alternative is appropriate for you

## 2022-06-22 NOTE — Telephone Encounter (Signed)
Tammy can you please advise?  Formulary alternative(s) are [employer], Anoro Ellipta, Trelegy Ellipta.   Please see previous message from PA team

## 2022-06-23 NOTE — Telephone Encounter (Signed)
Unfortunately Breztri inhaler is not covered.   Recommend switching over to Trelegy 200 1 puff daily, rinse after use.  #1 inhaler with 5 refills Please notify patient that insurance would not cover Breztri.  I will need the above change

## 2022-06-24 ENCOUNTER — Telehealth: Payer: Self-pay | Admitting: *Deleted

## 2022-06-24 MED ORDER — TRELEGY ELLIPTA 200-62.5-25 MCG/ACT IN AEPB: 1.0000 | INHALATION_SPRAY | Freq: Every day | RESPIRATORY_TRACT | 5 refills | Status: DC

## 2022-06-24 NOTE — Telephone Encounter (Signed)
Since Dr. Isaiah Serge has not responded, sending to [facility] for review and see what she recommends.  Pt does have an upcoming visit with TP 4/15 and this can be discussed during that visit if needed.

## 2022-06-24 NOTE — Telephone Encounter (Signed)
Script for [employer] and faxed to [employer] patient assistance.  Received verification fax that it was sent successfully.  Nothing further needed.

## 2022-06-24 NOTE — Telephone Encounter (Signed)
Pt's spouse returned call. Let her know the info about the PA and that Trelegy was preferred and that TP recommended having Rx sent to pharmacy. Understanding was verbalized and sent Rx to pharmacy for pt. Nothing further needed.

## 2022-06-24 NOTE — Telephone Encounter (Signed)
Okay change Combivent to Albuterol inhaler to use as needed  Can see if he has this already .  Can discuss at ov next week if he has additional questions

## 2022-06-24 NOTE — Telephone Encounter (Signed)
Refer to encounter from 4/9. PA for Markus Daft was done and it was denied and TP recommended having Trelegy sent to pharmacy since it was covered.  Spoke with pt's spouse about this letting her know that the pt assistance for Markus Daft was sent but she said since Trelegy was preferred/covered to go ahead and send Rx for that in for pt. Discontinued Breztri and sent Rx for Trelegy to pharmacy for pt. Nothing further needed.

## 2022-06-24 NOTE — Telephone Encounter (Signed)
ATC X1 LVM for patient to call the office back. Please verify if patient already uses Albuterol. If he needs refills verify pharmacy

## 2022-06-24 NOTE — Telephone Encounter (Signed)
Attempted to call pt but unable to reach. Left message to return call.  

## 2022-06-27 ENCOUNTER — Ambulatory Visit: Payer: 59 | Admitting: Adult Health

## 2022-06-27 ENCOUNTER — Encounter: Payer: Self-pay | Admitting: Adult Health

## 2022-06-27 VITALS — BP 126/74 | HR 63 | Temp 97.7°F | Ht 67.0 in | Wt 188.2 lb

## 2022-06-27 DIAGNOSIS — J449 Chronic obstructive pulmonary disease, unspecified: Secondary | ICD-10-CM | POA: Diagnosis not present

## 2022-06-27 DIAGNOSIS — R9389 Abnormal findings on diagnostic imaging of other specified body structures: Secondary | ICD-10-CM

## 2022-06-27 DIAGNOSIS — J9611 Chronic respiratory failure with hypoxia: Secondary | ICD-10-CM | POA: Diagnosis not present

## 2022-06-27 MED ORDER — TRELEGY ELLIPTA 200-62.5-25 MCG/ACT IN AEPB: 1.0000 | INHALATION_SPRAY | Freq: Every day | RESPIRATORY_TRACT | 0 refills | Status: DC

## 2022-06-27 NOTE — Addendum Note (Signed)
Addended byClyda Greener M on: 06/27/2022 09:30 AM   Modules accepted: Orders

## 2022-06-27 NOTE — Assessment & Plan Note (Signed)
Continue on oxygen 3 L with activity and 2.5 L of oxygen at bedtime.  We have contacted the DME again and orders have been put in for a POC device.  Plan  Patient Instructions  Continue on Trelegy 1 puff daily, rinse after use.  Continue Albuterol inhaler 1-2 puffs every 4hr as needed for shortness of breath/wheezing or Duoneb As needed   Continue 2.5L Oxygen at bedtime Continue on Oxygen 3l/m with activity  Order for POC  Begin Claritin 10mg  daily  Try Flonase 2 puffs daily As needed  nasal congestion  Saline nasal spray Twice daily  As needed  nasal dryness.  Continue on with yearly Lung cancer CT chest screening .  Pulse oximeter (check O2 saturations)  Follow up with Dr. Isaiah Serge or Jessly Lebeck NP  in 3 months and As needed   Please contact office for sooner follow up if symptoms do not improve or worsen or seek emergency care

## 2022-06-27 NOTE — Addendum Note (Signed)
Addended byClyda Greener M on: 06/27/2022 09:47 AM   Modules accepted: Orders

## 2022-06-27 NOTE — Assessment & Plan Note (Signed)
Small pulmonary nodule stable on most recent CT chest on June 22, 2022.  Continue with yearly CT chest screening program.

## 2022-06-27 NOTE — Patient Instructions (Addendum)
Continue on Trelegy 1 puff daily, rinse after use.  Continue Albuterol inhaler 1-2 puffs every 4hr as needed for shortness of breath/wheezing or Duoneb As needed   Continue 2.5L Oxygen at bedtime Continue on Oxygen 3l/m with activity  Order for POC  Begin Claritin 10mg  daily  Try Flonase 2 puffs daily As needed  nasal congestion  Saline nasal spray Twice daily  As needed  nasal dryness.  Continue on with yearly Lung cancer CT chest screening .  Pulse oximeter (check O2 saturations)  Follow up with Dr. Isaiah Serge or Darryel Diodato NP  in 3 months and As needed   Please contact office for sooner follow up if symptoms do not improve or worsen or seek emergency care

## 2022-06-27 NOTE — Assessment & Plan Note (Signed)
Recent COPD exacerbation appears to be improving.-Will add in Claritin and Flonase for possible triggers.  Continue on triple therapy maintenance regimen.  Albuterol and DuoNeb as needed.  Activity as tolerated.  Continue with yearly lung cancer CT screening program.  Plan  Patient Instructions  Continue on Trelegy 1 puff daily, rinse after use.  Continue Albuterol inhaler 1-2 puffs every 4hr as needed for shortness of breath/wheezing or Duoneb As needed   Continue 2.5L Oxygen at bedtime Continue on Oxygen 3l/m with activity  Order for POC  Begin Claritin 10mg  daily  Try Flonase 2 puffs daily As needed  nasal congestion  Saline nasal spray Twice daily  As needed  nasal dryness.  Continue on with yearly Lung cancer CT chest screening .  Pulse oximeter (check O2 saturations)  Follow up with Dr. Isaiah Serge or Raven Harmes NP  in 3 months and As needed   Please contact office for sooner follow up if symptoms do not improve or worsen or seek emergency care

## 2022-06-27 NOTE — Progress Notes (Signed)
  ID: Jeff Axon., male    DOB: Jul 13, 1965, 57 y.o.   MRN: [id number]  Chief Complaint  Patient presents with   Follow-up    Referring provider: Assunta Found, MD  HPI: 57 year old male former smoker followed for very severe COPD and chronic respiratory failure on home oxygen Medical history significant for early onset dementia  TEST/EVENTS :  PFTs May 13, 2021 showed FEV1 at 26%, ratio 39, FVC 52%, no significant bronchodilator response, DLCO 60%.   LDCT 06/02/20 >> Mild centrilobular and paraseptal emphysematous changed, upper lung predominant. Mild lingular scarring. Small mediastinal lymph nodules which do not meet pathologic CT size criteria.   Alpha-1 MM phenotype normal level  06/27/2022 Follow up : COPD, O2 RF  Patient presents for 2-week follow-up.  Patient was seen last visit for a slow to resolve COPD exacerbation after having COVID-19 infection.  Patient was initially treated with antibiotics and steroids.  Chest x-ray showed clear lungs.  Last visit patient was given a prednisone taper.  Had exertional oxygen desaturations.  He was restarted on oxygen with activity.  POC was ordered.  Unfortunately patient did not get his portable oxygen concentrator.  We contacted the DME company and they are requiring a new order due to administration issues. Patient does complain of some nasal congestion and postnasal drip.  Takes Claritin on occasion.  Patient's insurance would not cover Markus Daft so he has been switched over to Trelegy.  Does feel like it is working well.  Continues to have some intermittent cough and wheezing.  But definitely feeling better. Trying to be active walking each day.  Also able to do light yard work. Patient participates in the lung cancer CT screening program.  CT chest done on April 10 showed COPD and emphysematous changes.  No suspicious nodules.  No Known Allergies  Immunization History  Administered Date(s) Administered   Influenza Whole  01/28/2020   Influenza,inj,Quad PF,6+ Mos 12/28/2015, 12/26/2016, 12/27/2017   Influenza-Unspecified 11/12/2020    Past Medical History:  Diagnosis Date   Asthma    COPD (chronic obstructive pulmonary disease)    Nocturnal hypoxemia     Tobacco History: Social History   Tobacco Use  Smoking Status Former   Packs/day: 1.00   Years: 35.00   Additional pack years: 0.00   Total pack years: 35.00   Types: Cigarettes   Quit date: 06/12/2017   Years since quitting: 5.0  Smokeless Tobacco Never   Counseling given: Not Answered   Outpatient Medications Prior to Visit  Medication Sig Dispense Refill   albuterol (VENTOLIN HFA) 108 (90 Base) MCG/ACT inhaler Inhale 2 puffs into the lungs every 4 (four) hours as needed.     benzonatate (TESSALON) 200 MG capsule Take 1 capsule (200 mg total) by mouth daily as needed for cough. 20 capsule 3   Fluticasone-Umeclidin-Vilant (TRELEGY ELLIPTA) 200-62.5-25 MCG/ACT AEPB Inhale 1 puff into the lungs daily. 60 each 5   Ipratropium-Albuterol (COMBIVENT RESPIMAT) 20-100 MCG/ACT AERS respimat INHALE 1 PUFF EVERY 6 HOURS 4 g 5   ipratropium-albuterol (DUONEB) 0.5-2.5 (3) MG/3ML SOLN Take 3 mLs by nebulization in the morning, at noon, in the evening, and at bedtime. (Patient taking differently: Take 3 mLs by nebulization in the morning and at bedtime.) 360 mL 1   levalbuterol (XOPENEX HFA) 45 MCG/ACT inhaler Inhale 1 puff into the lungs every 4 (four) hours as needed for shortness of breath.     MAGNESIUM PO Take 1 tablet by mouth daily.  Multiple Vitamins-Minerals (ZINC PO) Take 1 tablet by mouth daily.     Omega-3 Fatty Acids (FISH OIL PO) Take 1 tablet by mouth daily.     TURMERIC PO Take 1 tablet by mouth daily.     celecoxib (CELEBREX) 200 MG capsule Take 200 mg by mouth daily. (Patient not taking: Reported on 06/27/2022)     CINNAMON PO Take 1 tablet by mouth daily. (Patient not taking: Reported on 06/27/2022)     cyclobenzaprine (FLEXERIL) 10 MG  tablet Take 10 mg by mouth every 8 (eight) hours as needed. (Patient not taking: Reported on 06/27/2022)     doxycycline (VIBRA-TABS) 100 MG tablet Take 1 tablet (100 mg total) by mouth 2 (two) times daily. (Patient not taking: Reported on 06/27/2022) 14 tablet 0   Ginger, Zingiber officinalis, (GINGER PO) Take 1 tablet by mouth daily. (Patient not taking: Reported on 06/27/2022)     meloxicam (MOBIC) 15 MG tablet Take 7.5 mg by mouth daily. (Patient not taking: Reported on 06/27/2022)     predniSONE (DELTASONE) 10 MG tablet 4 tabs for 2 days, then 3 tabs for 2 days, 2 tabs for 2 days, then 1 tab for 2 days, then stop (Patient not taking: Reported on 06/07/2022) 20 tablet 0   predniSONE (DELTASONE) 10 MG tablet 4 tabs for 2 days, then 3 tabs for 2 days, 2 tabs for 2 days, then 1 tab for 2 days, then stop (Patient not taking: Reported on 06/27/2022) 20 tablet 0   No facility-administered medications prior to visit.     Review of Systems:   Constitutional:   No  weight loss, night sweats,  Fevers, chills, fatigue, or  lassitude.  HEENT:   No headaches,  Difficulty swallowing,  Tooth/dental problems, or  Sore throat,                No sneezing, itching, ear ache,  +nasal congestion, post nasal drip,   CV:  No chest pain,  Orthopnea, PND, swelling in lower extremities, anasarca, dizziness, palpitations, syncope.   GI  No heartburn, indigestion, abdominal pain, nausea, vomiting, diarrhea, change in bowel habits, loss of appetite, bloody stools.   Resp: .  No chest wall deformity  Skin: no rash or lesions.  GU: no dysuria, change in color of urine, no urgency or frequency.  No flank pain, no hematuria   MS:  No joint pain or swelling.  No decreased range of motion.  No back pain.    Physical Exam  BP 126/74 (BP Location: Left Arm, Patient Position: Sitting, Cuff Size: Normal)   Pulse 72   Temp 97.7 F (36.5 C) (Oral)   Ht  (1.702 m)   Wt 188 lb 3.2 oz (85.4 kg)   SpO2 (!) 88%   BMI  29.48 kg/m   GEN: A/Ox3; pleasant , NAD, well nourished    HEENT:  [place]/AT,  EACs-clear, TMs-wnl, NOSE-clear, THROAT-clear, no lesions, no postnasal drip or exudate noted.   NECK:  Supple w/ fair ROM; no JVD; normal carotid impulses w/o bruits; no thyromegaly or nodules palpated; no lymphadenopathy.    RESP few trace expiratory wheezes on forced expiration.Marland Kitchen  Speaks in full sentences.  no accessory muscle use, no dullness to percussion  CARD:  RRR, no m/r/g, no peripheral edema, pulses intact, no cyanosis or clubbing.  GI:   Soft & nt; nml bowel sounds; no organomegaly or masses detected.   Musco: Warm bil, no deformities or joint swelling noted.   Neuro: alert,  no focal deficits noted.    Skin: Warm, no lesions or rashes    Lab Results:    BNP No results found for: "BNP"  ProBNP No results found for: "PROBNP"  Imaging: CT CHEST LUNG CA SCREEN LOW DOSE W/O CM  Result Date: 06/22/2022 CLINICAL DATA:  Lung cancer screening. Thirty-eight pack-year history. Former asymptomatic smoker. EXAM: CT CHEST WITHOUT CONTRAST LOW-DOSE FOR LUNG CANCER SCREENING TECHNIQUE: Multidetector CT imaging of the chest was performed following the standard protocol without IV contrast. RADIATION DOSE REDUCTION: This exam was performed according to the departmental dose-optimization program which includes automated exposure control, adjustment of the mA and/or kV according to patient size and/or use of iterative reconstruction technique. COMPARISON:  06/01/2020 FINDINGS: Cardiovascular: Heart size is normal. Aortic atherosclerosis and coronary artery calcification. No pericardial effusion identified. Mediastinum/Nodes: No enlarged mediastinal, hilar, or axillary lymph nodes. Thyroid gland, trachea, and esophagus demonstrate no significant findings. Lungs/Pleura: Paraseptal and centrilobular emphysema. Diffuse bronchial wall thickening. No pleural effusion or airspace consolidation. Stable appearance of small  lung nodules. The largest is in the subpleural posterior left lower lobe with a mean derived diameter of 3.4 mm. Calcified granuloma noted in the right upper lobe. No suspicious lung nodules identified Upper Abdomen: No acute abnormality. Musculoskeletal: No acute or suspicious osseous findings. Thoracic spondylosis identified. IMPRESSION: 1. Lung-RADS 2, benign appearance or behavior. Continue annual screening with low-dose chest CT without contrast in 12 months. 2. Aortic Atherosclerosis (ICD10-I70.0) and Emphysema (ICD10-J43.9). Electronically Signed   By: Signa Kell M.D.   On: 06/22/2022 15:14   DG Chest 2 View  Result Date: 06/01/2022 CLINICAL DATA:  Cough for 1 week, some chest pain and shortness of breath, history COPD EXAM: CHEST - 2 VIEW COMPARISON:  01/28/2022 FINDINGS: Upper normal heart size. Mediastinal contours and pulmonary vascularity normal. Lungs slightly hyperinflated but clear. No pulmonary infiltrate, pleural effusion, or pneumothorax. Mild osseous demineralization with degenerative changes at caudal aspect of thoracic spine. IMPRESSION: No acute abnormalities. Electronically Signed   By: Ulyses Southward M.D.   On: 06/01/2022 16:40         Latest Ref Rng & Units 05/13/2021    8:57 AM  PFT Results  FVC-Pre L 1.98   FVC-Predicted Pre % 46   FVC-Post L 2.23   FVC-Predicted Post % 52   Pre FEV1/FVC % % 41   Post FEV1/FCV % % 39   FEV1-Pre L 0.81   FEV1-Predicted Pre % 24   FEV1-Post L 0.88   DLCO uncorrected ml/min/mmHg 15.08   DLCO UNC% % 60   DLVA Predicted % 100   TLC L 4.75   TLC % Predicted % 76   RV % Predicted % 132     No results found for: "NITRICOXIDE"      Assessment & Plan:   COPD mixed type (HCC) Recent COPD exacerbation appears to be improving.-Will add in Claritin and Flonase for possible triggers.  Continue on triple therapy maintenance regimen.  Albuterol and DuoNeb as needed.  Activity as tolerated.  Continue with yearly lung cancer CT screening  program.  Plan  Patient Instructions  Continue on Trelegy 1 puff daily, rinse after use.  Continue Albuterol inhaler 1-2 puffs every 4hr as needed for shortness of breath/wheezing or Duoneb As needed   Continue 2.5L Oxygen at bedtime Continue on Oxygen 3l/m with activity  Order for POC  Begin Claritin 10mg  daily  Try Flonase 2 puffs daily As needed  nasal congestion  Saline nasal spray Twice  daily  As needed  nasal dryness.  Continue on with yearly Lung cancer CT chest screening .  Pulse oximeter (check O2 saturations)  Follow up with Dr. Isaiah Serge or Myalynn Lingle NP  in 3 months and As needed   Please contact office for sooner follow up if symptoms do not improve or worsen or seek emergency care     Chronic respiratory failure with hypoxia (HCC) Continue on oxygen 3 L with activity and 2.5 L of oxygen at bedtime.  We have contacted the DME again and orders have been put in for a POC device.  Plan  Patient Instructions  Continue on Trelegy 1 puff daily, rinse after use.  Continue Albuterol inhaler 1-2 puffs every 4hr as needed for shortness of breath/wheezing or Duoneb As needed   Continue 2.5L Oxygen at bedtime Continue on Oxygen 3l/m with activity  Order for POC  Begin Claritin 10mg  daily  Try Flonase 2 puffs daily As needed  nasal congestion  Saline nasal spray Twice daily  As needed  nasal dryness.  Continue on with yearly Lung cancer CT chest screening .  Pulse oximeter (check O2 saturations)  Follow up with Dr. Isaiah Serge or Kristin Lamagna NP  in 3 months and As needed   Please contact office for sooner follow up if symptoms do not improve or worsen or seek emergency care     Abnormal CT scan, chest Small pulmonary nodule stable on most recent CT chest on June 22, 2022.  Continue with yearly CT chest screening program.     Rubye Oaks, NP 06/27/2022

## 2022-07-05 DIAGNOSIS — F03A0 Unspecified dementia, mild, without behavioral disturbance, psychotic disturbance, mood disturbance, and anxiety: Secondary | ICD-10-CM | POA: Diagnosis not present

## 2022-07-13 ENCOUNTER — Telehealth: Payer: Self-pay | Admitting: Pulmonary Disease

## 2022-07-13 MED ORDER — AZITHROMYCIN 250 MG PO TABS: ORAL_TABLET | ORAL | 0 refills | Status: DC

## 2022-07-13 NOTE — Telephone Encounter (Signed)
I'll send in zpack. If not better needs follow-up in office. Continue Trelegy as directed and mucinex twice daily. Use duoneb every 6 hours for the next 3-5 days until symptoms are better.   Reviewed LDCT in April which showed paraseptal and centrilobular emphysema. Diffuse bronchial wall thickening.

## 2022-07-13 NOTE — Telephone Encounter (Signed)
PT not getting better from the last visit. Congestion not better, O2 levels very low. PT diag w/Alzheimers so please call [phone] Luster Landsberg is wife.

## 2022-07-13 NOTE — Telephone Encounter (Addendum)
Spoke with patients wife she states patient is still congested, dry cough-states you can hear it in his chest Patient is using mucinex, Claritin, benadryl, Flonase. No relief with any of these medications.  Pharmacy [facility] in [place] can you please advise

## 2022-07-14 NOTE — Telephone Encounter (Signed)
Called the pt and there was no answer- LMTCB    

## 2022-07-15 NOTE — Telephone Encounter (Signed)
Called and spoke with patient's wife, Luster Landsberg ([place]).  She stated they picked up the Z-pack and he was on his 3rd day and was no better.  He is still coughing a lot.  I scheduled the first available appointment with Beth on 07/21/22 at 8:30 am, advised to arrive by 8:15 am for check in.  Advised if he gets worse over the weekend to see care at an [facility] or ED if necessary.  She verbalized understanding.  Nothing further needed.  She wanted to make sure Beth knew that he had recently been diagnosed with Lewy Body Dementia.  Beth, this is just an [place].

## 2022-07-19 DIAGNOSIS — J9611 Chronic respiratory failure with hypoxia: Secondary | ICD-10-CM | POA: Diagnosis not present

## 2022-07-20 NOTE — Progress Notes (Unsigned)
@Patient  ID: Jeff Cruz., male    DOB: 26-Jun-1965, 58 y.o.   MRN: [id number]  No chief complaint on file.   Referring provider: Assunta Found, MD  HPI: 57 year old male, former heavy smoker. PMH significant for mixed type COPD, chronic respiratory failure,    07/21/2022 Patient presents today for acute OV.  Maintained on Trelegy and nocturnal oxygen Recently prescribed Zpack due to bronchitis symptoms without reported improvement  LDCT in April showed paraseptal and centrilobular emphysema along with diffuse bronchial wall thickening. Stgable appearance of small lung nodules  No Known Allergies  Immunization History  Administered Date(s) Administered   Influenza Whole 01/28/2020   Influenza,inj,Quad PF,6+ Mos 12/28/2015, 12/26/2016, 12/27/2017   Influenza-Unspecified 11/12/2020    Past Medical History:  Diagnosis Date   Asthma    COPD (chronic obstructive pulmonary disease) (HCC)    Nocturnal hypoxemia     Tobacco History: Social History   Tobacco Use  Smoking Status Former   Packs/day: 1.00   Years: 35.00   Additional pack years: 0.00   Total pack years: 35.00   Types: Cigarettes   Quit date: 06/12/2017   Years since quitting: 5.1  Smokeless Tobacco Never   Counseling given: Not Answered   Outpatient Medications Prior to Visit  Medication Sig Dispense Refill   albuterol (VENTOLIN HFA) 108 (90 Base) MCG/ACT inhaler Inhale 2 puffs into the lungs every 4 (four) hours as needed.     azithromycin (ZITHROMAX Z-PAK) 250 MG tablet Per pack instructions 6 tablet 0   benzonatate (TESSALON) 200 MG capsule Take 1 capsule (200 mg total) by mouth daily as needed for cough. 20 capsule 3   celecoxib (CELEBREX) 200 MG capsule Take 200 mg by mouth daily. (Patient not taking: Reported on 06/27/2022)     CINNAMON PO Take 1 tablet by mouth daily. (Patient not taking: Reported on 06/27/2022)     cyclobenzaprine (FLEXERIL) 10 MG tablet Take 10 mg by mouth every 8 (eight) hours  as needed. (Patient not taking: Reported on 06/27/2022)     doxycycline (VIBRA-TABS) 100 MG tablet Take 1 tablet (100 mg total) by mouth 2 (two) times daily. (Patient not taking: Reported on 06/27/2022) 14 tablet 0   Fluticasone-Umeclidin-Vilant (TRELEGY ELLIPTA) 200-62.5-25 MCG/ACT AEPB Inhale 1 puff into the lungs daily. 60 each 5   Fluticasone-Umeclidin-Vilant (TRELEGY ELLIPTA) 200-62.5-25 MCG/ACT AEPB Inhale 1 puff into the lungs daily. 1 each 0   Ginger, Zingiber officinalis, (GINGER PO) Take 1 tablet by mouth daily. (Patient not taking: Reported on 06/27/2022)     Ipratropium-Albuterol (COMBIVENT RESPIMAT) 20-100 MCG/ACT AERS respimat INHALE 1 PUFF EVERY 6 HOURS 4 g 5   ipratropium-albuterol (DUONEB) 0.5-2.5 (3) MG/3ML SOLN Take 3 mLs by nebulization in the morning, at noon, in the evening, and at bedtime. (Patient taking differently: Take 3 mLs by nebulization in the morning and at bedtime.) 360 mL 1   levalbuterol (XOPENEX HFA) 45 MCG/ACT inhaler Inhale 1 puff into the lungs every 4 (four) hours as needed for shortness of breath.     MAGNESIUM PO Take 1 tablet by mouth daily.     meloxicam (MOBIC) 15 MG tablet Take 7.5 mg by mouth daily. (Patient not taking: Reported on 06/27/2022)     Multiple Vitamins-Minerals (ZINC PO) Take 1 tablet by mouth daily.     Omega-3 Fatty Acids (FISH OIL PO) Take 1 tablet by mouth daily.     predniSONE (DELTASONE) 10 MG tablet 4 tabs for 2 days, then 3 tabs  for 2 days, 2 tabs for 2 days, then 1 tab for 2 days, then stop (Patient not taking: Reported on 06/07/2022) 20 tablet 0   predniSONE (DELTASONE) 10 MG tablet 4 tabs for 2 days, then 3 tabs for 2 days, 2 tabs for 2 days, then 1 tab for 2 days, then stop (Patient not taking: Reported on 06/27/2022) 20 tablet 0   TURMERIC PO Take 1 tablet by mouth daily.     No facility-administered medications prior to visit.      Review of Systems  Review of Systems   Physical Exam  There were no vitals taken for this  visit. Physical Exam   Lab Results:  CBC    Component Value Date/Time   WBC 9.2 11/26/2019 0930   RBC 4.23 11/26/2019 0930   HGB 13.2 11/26/2019 0930   HCT 40.3 11/26/2019 0930   PLT 272.0 11/26/2019 0930   MCV 95.3 11/26/2019 0930   MCHC 32.8 11/26/2019 0930   RDW 14.4 11/26/2019 0930   LYMPHSABS 1.9 11/26/2019 0930   MONOABS 0.6 11/26/2019 0930   EOSABS 0.2 11/26/2019 0930   BASOSABS 0.1 11/26/2019 0930    BMET    Component Value Date/Time   NA 136 01/28/2022 1030   K 3.9 01/28/2022 1030   CL 97 01/28/2022 1030   CO2 34 (H) 01/28/2022 1030   GLUCOSE 113 (H) 01/28/2022 1030   BUN 13 01/28/2022 1030   CREATININE 0.74 01/28/2022 1030   CALCIUM 9.2 01/28/2022 1030    BNP No results found for: "BNP"  ProBNP No results found for: "PROBNP"  Imaging: CT CHEST LUNG CA SCREEN LOW DOSE W/O CM  Result Date: 06/22/2022 CLINICAL DATA:  Lung cancer screening. Thirty-eight pack-year history. Former asymptomatic smoker. EXAM: CT CHEST WITHOUT CONTRAST LOW-DOSE FOR LUNG CANCER SCREENING TECHNIQUE: Multidetector CT imaging of the chest was performed following the standard protocol without IV contrast. RADIATION DOSE REDUCTION: This exam was performed according to the departmental dose-optimization program which includes automated exposure control, adjustment of the mA and/or kV according to patient size and/or use of iterative reconstruction technique. COMPARISON:  06/01/2020 FINDINGS: Cardiovascular: Heart size is normal. Aortic atherosclerosis and coronary artery calcification. No pericardial effusion identified. Mediastinum/Nodes: No enlarged mediastinal, hilar, or axillary lymph nodes. Thyroid gland, trachea, and esophagus demonstrate no significant findings. Lungs/Pleura: Paraseptal and centrilobular emphysema. Diffuse bronchial wall thickening. No pleural effusion or airspace consolidation. Stable appearance of small lung nodules. The largest is in the subpleural posterior left lower  lobe with a mean derived diameter of 3.4 mm. Calcified granuloma noted in the right upper lobe. No suspicious lung nodules identified Upper Abdomen: No acute abnormality. Musculoskeletal: No acute or suspicious osseous findings. Thoracic spondylosis identified. IMPRESSION: 1. Lung-RADS 2, benign appearance or behavior. Continue annual screening with low-dose chest CT without contrast in 12 months. 2. Aortic Atherosclerosis (ICD10-I70.0) and Emphysema (ICD10-J43.9). Electronically Signed   By: Signa Kell M.D.   On: 06/22/2022 15:14     Assessment & Plan:   No problem-specific Assessment & Plan notes found for this encounter.     Glenford Bayley, NP 07/20/2022

## 2022-07-21 ENCOUNTER — Ambulatory Visit: Payer: 59 | Admitting: Primary Care

## 2022-07-21 ENCOUNTER — Encounter: Payer: Self-pay | Admitting: Primary Care

## 2022-07-21 VITALS — BP 140/62 | HR 77 | Temp 98.3°F | Ht 67.0 in | Wt 195.2 lb

## 2022-07-21 DIAGNOSIS — J9611 Chronic respiratory failure with hypoxia: Secondary | ICD-10-CM | POA: Diagnosis not present

## 2022-07-21 DIAGNOSIS — J441 Chronic obstructive pulmonary disease with (acute) exacerbation: Secondary | ICD-10-CM | POA: Diagnosis not present

## 2022-07-21 MED ORDER — LEVOFLOXACIN 500 MG PO TABS: 500.0000 mg | ORAL_TABLET | Freq: Every day | ORAL | 0 refills | Status: DC

## 2022-07-21 MED ORDER — METHYLPREDNISOLONE ACETATE 80 MG/ML IJ SUSP
80.0000 mg | Freq: Once | INTRAMUSCULAR | Status: AC
  Administered 2022-07-21: 80 mg via INTRAMUSCULAR

## 2022-07-21 MED ORDER — PREDNISONE 10 MG PO TABS
ORAL_TABLET | ORAL | 0 refills | Status: DC
Start: 2022-07-21 — End: 2022-08-10

## 2022-07-21 NOTE — Patient Instructions (Addendum)
Recommendations: - Look into getting wedge pillow - Wear oxygen 24/7 - Take mucinex 1,200mg  twice daily for the nexy 5-7 days; then as needed  - Continue Trelegy one puff daily in the morning  - Use nebulizer three times a day for the next 5-7 days; then as needed  - Take antibiotic and prednisone taper as prescribed   Orders: - Sputum sample if able  Office treatment: - Depo-medrol 80mg  IM x1   Follow-up - 2 weeks with Dr. Isaiah Serge [facility] NP if no openings)

## 2022-07-21 NOTE — Addendum Note (Signed)
Addended by: Charlott Holler on: 07/21/2022 10:16 AM   Modules accepted: Orders

## 2022-07-21 NOTE — Assessment & Plan Note (Signed)
-   Received POC since last visit. O2 today was 84% RA, improved 92% 2L  - Continue 2-3L oxygen with activity to maintain O2 >88-90% and at night - Recommend patient get wedge pillow to elevate head of bed at night

## 2022-07-21 NOTE — Assessment & Plan Note (Signed)
-   Patient has worsening cough and wheezing symptoms since having Covid 3 months ago. He has very severe obstructive lung disease at baseline/ FEV1 27% predicted. Completed Zpack without improvement. Cough is productive with purulent sputum. CT in April showed paraseptal and centrilobular emphysema along with diffuse bronchial wall thickening. He had insp wheezing on exam today. Received depo-medrol 80mg  IM x 1. Advised patient start regular mucinex 1200mg  twice daily and use ipratropium-albuterol nebulizer q8 hours for the next 5-7 days or until symptoms are better. Continue Trelegy one puff daily. Sending in Levaquin 500mg  qd x 7 days and prednisone taper. Checking sputum sample. Follow-up in 2 weeks with Dr. Isaiah Serge [facility] NP if no openings)

## 2022-08-05 ENCOUNTER — Other Ambulatory Visit: Payer: Self-pay | Admitting: Adult Health

## 2022-08-05 ENCOUNTER — Ambulatory Visit: Payer: 59 | Admitting: Pulmonary Disease

## 2022-08-05 NOTE — Telephone Encounter (Signed)
Please advise on refill request

## 2022-08-05 NOTE — Telephone Encounter (Signed)
Approved.  

## 2022-08-10 ENCOUNTER — Ambulatory Visit (INDEPENDENT_AMBULATORY_CARE_PROVIDER_SITE_OTHER): Payer: 59

## 2022-08-10 ENCOUNTER — Encounter: Payer: Self-pay | Admitting: Adult Health

## 2022-08-10 ENCOUNTER — Ambulatory Visit: Payer: 59 | Admitting: Adult Health

## 2022-08-10 VITALS — BP 108/60 | HR 69 | Temp 97.8°F | Ht 67.0 in | Wt 197.8 lb

## 2022-08-10 DIAGNOSIS — R053 Chronic cough: Secondary | ICD-10-CM

## 2022-08-10 DIAGNOSIS — J441 Chronic obstructive pulmonary disease with (acute) exacerbation: Secondary | ICD-10-CM | POA: Diagnosis not present

## 2022-08-10 DIAGNOSIS — J9611 Chronic respiratory failure with hypoxia: Secondary | ICD-10-CM

## 2022-08-10 LAB — CBC WITH DIFFERENTIAL/PLATELET
Basophils Absolute: 0.1 10*3/uL (ref 0.0–0.1)
Basophils Relative: 1.1 % (ref 0.0–3.0)
Eosinophils Absolute: 0.4 10*3/uL (ref 0.0–0.7)
Eosinophils Relative: 4.8 % (ref 0.0–5.0)
HCT: 39.5 % (ref 39.0–52.0)
Hemoglobin: 12.7 g/dL — ABNORMAL LOW (ref 13.0–17.0)
Lymphocytes Relative: 26.6 % (ref 12.0–46.0)
Lymphs Abs: 2 10*3/uL (ref 0.7–4.0)
MCHC: 32.1 g/dL (ref 30.0–36.0)
MCV: 93.9 fl (ref 78.0–100.0)
Monocytes Absolute: 0.5 10*3/uL (ref 0.1–1.0)
Monocytes Relative: 7 % (ref 3.0–12.0)
Neutro Abs: 4.6 10*3/uL (ref 1.4–7.7)
Neutrophils Relative %: 60.5 % (ref 43.0–77.0)
Platelets: 226 10*3/uL (ref 150.0–400.0)
RBC: 4.2 Mil/uL — ABNORMAL LOW (ref 4.22–5.81)
RDW: 14.6 % (ref 11.5–15.5)
WBC: 7.5 10*3/uL (ref 4.0–10.5)

## 2022-08-10 LAB — BASIC METABOLIC PANEL
BUN: 19 mg/dL (ref 6–23)
CO2: 35 mEq/L — ABNORMAL HIGH (ref 19–32)
Calcium: 8.9 mg/dL (ref 8.4–10.5)
Chloride: 101 mEq/L (ref 96–112)
Creatinine, Ser: 0.69 mg/dL (ref 0.40–1.50)
GFR: 102.49 mL/min (ref >60.00)
Glucose, Bld: 170 mg/dL — ABNORMAL HIGH (ref 70–99)
Potassium: 4 mEq/L (ref 3.5–5.1)
Sodium: 140 mEq/L (ref 135–145)

## 2022-08-10 LAB — D-DIMER, QUANTITATIVE: D-Dimer, Quant: 0.51 mcg/mL FEU — ABNORMAL HIGH (ref <0.50)

## 2022-08-10 LAB — BRAIN NATRIURETIC PEPTIDE: Pro B Natriuretic peptide (BNP): 22 pg/mL (ref 0.0–100.0)

## 2022-08-10 MED ORDER — PREDNISONE 10 MG PO TABS
ORAL_TABLET | ORAL | 1 refills | Status: DC
Start: 2022-08-10 — End: 2022-09-13

## 2022-08-10 MED ORDER — ALBUTEROL SULFATE (2.5 MG/3ML) 0.083% IN NEBU: 2.5000 mg | INHALATION_SOLUTION | Freq: Four times a day (QID) | RESPIRATORY_TRACT | 5 refills | Status: DC | PRN

## 2022-08-10 MED ORDER — BENZONATATE 200 MG PO CAPS: 200.0000 mg | ORAL_CAPSULE | Freq: Three times a day (TID) | ORAL | 5 refills | Status: DC | PRN

## 2022-08-10 NOTE — Patient Instructions (Addendum)
Prednisone 20mg  daily for 1 week and 10mg  daily , hold at this dose.  Chest xray and Labs today  Sputum culture  Add Pepcid 20mg  At bedtime  .  Continue on Trelegy 1 puff daily, rinse after use.  Continue Albuterol inhaler 1-2 puffs every 4hr as needed for shortness of breath/wheezing or Albuterol Neb  As needed   Continue 2.5L Oxygen at bedtime Continue on Oxygen 3l/m with activity , goal is to have O2 sats >88-90%.  Claritin 10mg  daily  Add Chlorpheniramine 4mg  At bedtime  (Chlortabs)  Flonase 2 puffs daily As needed  nasal congestion  Saline nasal spray Twice daily  As needed  nasal dryness.  Mucinex DM Twice daily  for cough As needed   Add Flutter valve Twice daily   Tessalon Three times a day  As needed  cough  Sips of water to soothe throat and avoid throat clearing and cough,  NO mints .  Continue on with yearly Lung cancer CT chest screening .  Follow up with Dr. Isaiah Serge or Ryan Ogborn NP  in 3 weeks  and As needed   Please contact office for sooner follow up if symptoms do not improve or worsen or seek emergency care

## 2022-08-10 NOTE — Addendum Note (Signed)
Addended by: Delrae Rend on: 08/10/2022 10:37 AM   Modules accepted: Orders

## 2022-08-10 NOTE — Progress Notes (Signed)
Patient seen in the office today and instructed on use of flutter valve.  Patient expressed understanding and demonstrated technique.  

## 2022-08-10 NOTE — Assessment & Plan Note (Signed)
Continue on oxygen to maintain O2 saturation greater than 88 to 90%.  Encouraged on oxygen compliance.

## 2022-08-10 NOTE — Progress Notes (Signed)
@Patient  ID: Jeff Cruz., male    DOB: 09-Mar-1966, 57 y.o.   MRN: [id number]  Chief Complaint  Patient presents with   Follow-up    Referring provider: Assunta Found, MD  HPI: 57 year old former smoker followed for very severe COPD and chronic respiratory failure on home oxygen Medical history significant for early onset dementia-Lewy Body Dementia  TEST/EVENTS :  PFTs May 13, 2021 showed FEV1 at 26%, ratio 39, FVC 52%, no significant bronchodilator response, DLCO 60%.   LDCT 06/02/20 >> Mild centrilobular and paraseptal emphysematous changed, upper lung predominant. Mild lingular scarring. Small mediastinal lymph nodules which do not meet pathologic CT size criteria.    Alpha-1 MM phenotype normal level  CT chest June 21, 2022 emphysema, diffuse bronchial wall thickening stable small lung nodules maximum 3.4 mm, calcified granuloma in the right upper lobe lung RADS 2   08/10/2022 Follow up: GOLD IV COPD and chronic respiratory failure Patient returns for a 3-week follow-up.  Patient has been having difficulty over the last 3 to 4 months after having COVID-19 infection in February.  He continued to have ongoing cough wheezing and increased shortness of breath. Has been treated with several courses of antibiotics and steroids.  Restarted on Oxygen with activity due to exertional desaturations. Most recently seen 3 weeks ago and given Levaquin and a prednisone taper.  He remains on Trelegy inhaler daily and Duoneb 2-3 times a day. Since last visit patient is feeling only minimally better. Still has ongoing cough and wheezing. Gets winded easily . Not wearing Oxygen today, O2 sats 91% on room air.  Taking claritin , flonase and mucinex . Has sinus drainage on/off.  No hemoptysis ,fever, edema , calf edema, chest pain, orthopnea.  Eating good, no n/v/d.  No history of blood clots or CHF .   No Known Allergies  Immunization History  Administered Date(s) Administered   Influenza  Whole 01/28/2020   Influenza,inj,Quad PF,6+ Mos 12/28/2015, 12/26/2016, 12/27/2017   Influenza-Unspecified 11/12/2020    Past Medical History:  Diagnosis Date   Asthma    COPD (chronic obstructive pulmonary disease) (HCC)    Nocturnal hypoxemia     Tobacco History: Social History   Tobacco Use  Smoking Status Former   Packs/day: 1.00   Years: 35.00   Additional pack years: 0.00   Total pack years: 35.00   Types: Cigarettes   Quit date: 06/12/2017   Years since quitting: 5.1  Smokeless Tobacco Never   Counseling given: Not Answered   Outpatient Medications Prior to Visit  Medication Sig Dispense Refill   albuterol (VENTOLIN HFA) 108 (90 Base) MCG/ACT inhaler Inhale 2 puffs into the lungs every 4 (four) hours as needed.     CINNAMON PO Take 1 tablet by mouth daily.     Fluticasone-Umeclidin-Vilant (TRELEGY ELLIPTA) 200-62.5-25 MCG/ACT AEPB Inhale 1 puff into the lungs daily. 60 each 5   Ginger, Zingiber officinalis, (GINGER PO) Take 1 tablet by mouth daily.     guaiFENesin (MUCINEX) 600 MG 12 hr tablet Take 600 mg by mouth 2 (two) times daily.     loratadine (CLARITIN) 10 MG tablet Take 10 mg by mouth daily.     MAGNESIUM PO Take 1 tablet by mouth daily.     meloxicam (MOBIC) 15 MG tablet Take 7.5 mg by mouth daily.     Multiple Vitamins-Minerals (ZINC PO) Take 1 tablet by mouth daily.     Omega-3 Fatty Acids (FISH OIL PO) Take 1 tablet by mouth daily.  TURMERIC PO Take 1 tablet by mouth daily.     benzonatate (TESSALON) 200 MG capsule TAKE 1 CAPSULE BY MOUTH DAILY AS NEEDED FOR COUGH 20 capsule 0   Ipratropium-Albuterol (COMBIVENT RESPIMAT) 20-100 MCG/ACT AERS respimat INHALE 1 PUFF EVERY 6 HOURS 4 g 5   celecoxib (CELEBREX) 200 MG capsule Take 200 mg by mouth daily. (Patient not taking: Reported on 07/21/2022)     cyclobenzaprine (FLEXERIL) 10 MG tablet Take 10 mg by mouth every 8 (eight) hours as needed. (Patient not taking: Reported on 06/27/2022)      Fluticasone-Umeclidin-Vilant (TRELEGY ELLIPTA) 200-62.5-25 MCG/ACT AEPB Inhale 1 puff into the lungs daily. (Patient not taking: Reported on 08/10/2022) 1 each 0   ipratropium-albuterol (DUONEB) 0.5-2.5 (3) MG/3ML SOLN Take 3 mLs by nebulization in the morning, at noon, in the evening, and at bedtime. (Patient not taking: Reported on 08/10/2022) 360 mL 1   levalbuterol (XOPENEX HFA) 45 MCG/ACT inhaler Inhale 1 puff into the lungs every 4 (four) hours as needed for shortness of breath. (Patient not taking: Reported on 07/21/2022)     levofloxacin (LEVAQUIN) 500 MG tablet Take 1 tablet (500 mg total) by mouth daily. (Patient not taking: Reported on 08/10/2022) 7 tablet 0   predniSONE (DELTASONE) 10 MG tablet 4 tabs for 2 days, then 3 tabs for 2 days, 2 tabs for 2 days, then 1 tab for 2 days, then stop (Patient not taking: Reported on 06/27/2022) 20 tablet 0   predniSONE (DELTASONE) 10 MG tablet 4 tabs daily for 3 days, then 3 tabs daily for 3 days, 2 tabs daily for 3 days, then 1 tab daily for 3 days, then stop (Patient not taking: Reported on 08/10/2022) 30 tablet 0   No facility-administered medications prior to visit.     Review of Systems:   Constitutional:   No  weight loss, night sweats,  Fevers, chills,  +fatigue, or  lassitude.  HEENT:   No headaches,  Difficulty swallowing,  Tooth/dental problems, or  Sore throat,                No sneezing, itching, ear ache,  +nasal congestion, post nasal drip,   CV:  No chest pain,  Orthopnea, PND, swelling in lower extremities, anasarca, dizziness, palpitations, syncope.   GI  No heartburn, indigestion, abdominal pain, nausea, vomiting, diarrhea, change in bowel habits, loss of appetite, bloody stools.   Resp:   No chest wall deformity  Skin: no rash or lesions.  GU: no dysuria, change in color of urine, no urgency or frequency.  No flank pain, no hematuria   MS:  No joint pain or swelling.  No decreased range of motion.  No back  pain.    Physical Exam  BP 108/60 (BP Location: Left Arm, Patient Position: Sitting, Cuff Size: Normal)   Pulse 69   Temp 97.8 F (36.6 C) (Oral)   Ht 5\' 7"  (1.702 m)   Wt 197 lb 12.8 oz (89.7 kg)   SpO2 91%   BMI 30.98 kg/m   GEN: A/Ox3; pleasant , NAD, well nourished    HEENT:  [place]/AT,  NOSE-clear, THROAT-clear, no lesions, no postnasal drip or exudate noted.   NECK:  Supple w/ fair ROM; no JVD; normal carotid impulses w/o bruits; no thyromegaly or nodules palpated; no lymphadenopathy.    RESP  scattered rhonchi  and few exp wheezing  no accessory muscle use, no dullness to percussion  CARD:  RRR, no m/r/g, no peripheral edema, pulses intact, no cyanosis  or clubbing.Neg Homans sign   GI:   Soft & nt; nml bowel sounds; no organomegaly or masses detected.   Musco: Warm bil, no deformities or joint swelling noted.   Neuro: alert, no focal deficits noted.    Skin: Warm, no lesions or rashes    Lab Results:   BMET   BNP No results found for: "BNP"  ProBNP No results found for: "PROBNP"  Imaging: No results found.      Latest Ref Rng & Units 05/13/2021    8:57 AM  PFT Results  FVC-Pre L 1.98   FVC-Predicted Pre % 46   FVC-Post L 2.23   FVC-Predicted Post % 52   Pre FEV1/FVC % % 41   Post FEV1/FCV % % 39   FEV1-Pre L 0.81   FEV1-Predicted Pre % 24   FEV1-Post L 0.88   DLCO uncorrected ml/min/mmHg 15.08   DLCO UNC% % 60   DLVA Predicted % 100   TLC L 4.75   TLC % Predicted % 76   RV % Predicted % 132     No results found for: "NITRICOXIDE"      Assessment & Plan:   COPD with acute exacerbation (HCC) Ongoing COPD flare -increased symptom burden after Covid 19. Has not returned to baseline. Chest xray and CT chest in March and April respectively were unrevealing. Has received multiple courses of steroids and antibiotics without significant change . Will check Sputum culture. Continue with aggressive maintenance regimen with triple therapy ICS and  bronchodilators.  Nebulized albuterol.  Add flutter valve. Will add cough control regimen with Mucinex DM and Tessalon Perles. Continue on cough prevention regimen with chronic rhinitis and GERD treatment.  Add in Pepcid and chlor tabs.  Continue on Claritin and Flonase. Chest x-ray and labs looking for other etiology. If D-dimer is positive will need CTA with PE protocol If BNP is elevated consider 2D echo Begin low-dose prednisone with slow taper  Plan  Patient Instructions  Prednisone 20mg  daily for 1 week and 10mg  daily , hold at this dose.  Chest xray and Labs today  Sputum culture  Add Pepcid 20mg  At bedtime  .  Continue on Trelegy 1 puff daily, rinse after use.  Continue Albuterol inhaler 1-2 puffs every 4hr as needed for shortness of breath/wheezing or Albuterol Neb  As needed   Continue 2.5L Oxygen at bedtime Continue on Oxygen 3l/m with activity , goal is to have O2 sats >88-90%.  Claritin 10mg  daily  Add Chlorpheniramine 4mg  At bedtime  (Chlortabs)  Flonase 2 puffs daily As needed  nasal congestion  Saline nasal spray Twice daily  As needed  nasal dryness.  Mucinex DM Twice daily  for cough As needed   Add Flutter valve Twice daily   Tessalon Three times a day  As needed  cough  Sips of water to soothe throat and avoid throat clearing and cough,  NO mints .  Continue on with yearly Lung cancer CT chest screening .  Follow up with Dr. Isaiah Serge or Jiovanni Heeter NP  in 3 weeks  and As needed   Please contact office for sooner follow up if symptoms do not improve or worsen or seek emergency care     Chronic respiratory failure with hypoxia (HCC) Continue on oxygen to maintain O2 saturation greater than 88 to 90%.  Encouraged on oxygen compliance.    I spent  48 minutes dedicated to the care of this patient on the date of this encounter to include  pre-visit review of records, face-to-face time with the patient discussing conditions above, post visit ordering of testing, clinical  documentation with the electronic health record, making appropriate referrals as documented, and communicating necessary findings to members of the patients care team.     Rubye Oaks, NP 08/10/2022

## 2022-08-10 NOTE — Assessment & Plan Note (Addendum)
Ongoing COPD flare -increased symptom burden after Covid 19. Has not returned to baseline. Chest xray and CT chest in March and April respectively were unrevealing. Has received multiple courses of steroids and antibiotics without significant change . Will check Sputum culture. Continue with aggressive maintenance regimen with triple therapy ICS and bronchodilators.  Nebulized albuterol.  Add flutter valve. Will add cough control regimen with Mucinex DM and Tessalon Perles. Continue on cough prevention regimen with chronic rhinitis and GERD treatment.  Add in Pepcid and chlor tabs.  Continue on Claritin and Flonase. Chest x-ray and labs looking for other etiology. If D-dimer is positive will need CTA with PE protocol If BNP is elevated consider 2D echo Begin low-dose prednisone with slow taper  Plan  Patient Instructions  Prednisone 20mg  daily for 1 week and 10mg  daily , hold at this dose.  Chest xray and Labs today  Sputum culture  Add Pepcid 20mg  At bedtime  .  Continue on Trelegy 1 puff daily, rinse after use.  Continue Albuterol inhaler 1-2 puffs every 4hr as needed for shortness of breath/wheezing or Albuterol Neb  As needed   Continue 2.5L Oxygen at bedtime Continue on Oxygen 3l/m with activity , goal is to have O2 sats >88-90%.  Claritin 10mg  daily  Add Chlorpheniramine 4mg  At bedtime  (Chlortabs)  Flonase 2 puffs daily As needed  nasal congestion  Saline nasal spray Twice daily  As needed  nasal dryness.  Mucinex DM Twice daily  for cough As needed   Add Flutter valve Twice daily   Tessalon Three times a day  As needed  cough  Sips of water to soothe throat and avoid throat clearing and cough,  NO mints .  Continue on with yearly Lung cancer CT chest screening .  Follow up with Dr. Isaiah Serge or Makinsley Schiavi NP  in 3 weeks  and As needed   Please contact office for sooner follow up if symptoms do not improve or worsen or seek emergency care

## 2022-08-12 NOTE — Progress Notes (Signed)
Called and spoke with his wife, Luster Landsberg, provided results/recommendations per Rubye Oaks NP.  She verbalized understanding.  Nothing further needed.

## 2022-08-13 LAB — RESPIRATORY CULTURE OR RESPIRATORY AND SPUTUM CULTURE
MICRO NUMBER:: 15014431
RESULT:: NORMAL
SPECIMEN QUALITY:: ADEQUATE

## 2022-08-19 DIAGNOSIS — J9611 Chronic respiratory failure with hypoxia: Secondary | ICD-10-CM | POA: Diagnosis not present

## 2022-08-30 ENCOUNTER — Ambulatory Visit: Payer: 59 | Admitting: Adult Health

## 2022-09-10 ENCOUNTER — Other Ambulatory Visit: Payer: Self-pay | Admitting: Adult Health

## 2022-09-18 DIAGNOSIS — J9611 Chronic respiratory failure with hypoxia: Secondary | ICD-10-CM | POA: Diagnosis not present

## 2022-09-20 DIAGNOSIS — Z6831 Body mass index (BMI) 31.0-31.9, adult: Secondary | ICD-10-CM | POA: Diagnosis not present

## 2022-09-20 DIAGNOSIS — F411 Generalized anxiety disorder: Secondary | ICD-10-CM | POA: Diagnosis not present

## 2022-09-20 DIAGNOSIS — G3183 Dementia with Lewy bodies: Secondary | ICD-10-CM | POA: Diagnosis not present

## 2022-09-20 DIAGNOSIS — J449 Chronic obstructive pulmonary disease, unspecified: Secondary | ICD-10-CM | POA: Diagnosis not present

## 2022-09-20 DIAGNOSIS — E6609 Other obesity due to excess calories: Secondary | ICD-10-CM | POA: Diagnosis not present

## 2022-09-26 ENCOUNTER — Ambulatory Visit (INDEPENDENT_AMBULATORY_CARE_PROVIDER_SITE_OTHER): Payer: 59 | Admitting: Pulmonary Disease

## 2022-09-26 ENCOUNTER — Encounter: Payer: Self-pay | Admitting: Pulmonary Disease

## 2022-09-26 VITALS — BP 124/62 | HR 81 | Temp 98.2°F | Ht 67.0 in | Wt 202.4 lb

## 2022-09-26 DIAGNOSIS — J441 Chronic obstructive pulmonary disease with (acute) exacerbation: Secondary | ICD-10-CM

## 2022-09-26 DIAGNOSIS — J449 Chronic obstructive pulmonary disease, unspecified: Secondary | ICD-10-CM | POA: Diagnosis not present

## 2022-09-26 MED ORDER — AZITHROMYCIN 250 MG PO TABS: 250.0000 mg | ORAL_TABLET | ORAL | 5 refills | Status: DC

## 2022-09-26 MED ORDER — IPRATROPIUM-ALBUTEROL 0.5-2.5 (3) MG/3ML IN SOLN: 3.0000 mL | Freq: Four times a day (QID) | RESPIRATORY_TRACT | 11 refills | Status: DC | PRN

## 2022-09-26 NOTE — Progress Notes (Signed)
Jeff Cruz    [id number]    02/09/1966  Primary Care Physician:Golding, Jeff Ruiz, MD  Referring Physician: Assunta Found, MD [address] [place],  [place] [address]  Chief complaint: Follow-up for COPD  HPI: 57 y.o.  with severe COPD, mild secondary pulmonary hypertension, heavy ex-smoker.  Was initially maintained on Anoro.  Inhalers changed to [facility] in April 2021 by his primary care.  Patient feels that the new inhaler works better Complains of chronic cough with white mucus, dyspnea on exertion at baseline.  Denies any snoring, excessive daytime sleepiness.  Currently on nocturnal oxygen.  Pets: 2 dogs, no birds Occupation: Works as a Energy manager.  Repairs boat motors Exposures: No known exposures.  No mold, hot tub, Jacuzzi.  No feather pillows or comforters Smoking history: 40-60-pack-year smoker.  Quit in 2020 Travel history: No significant travel history Relevant family history: Mom had COPD.  She was a smoker  Interim history: Developed COVID-19 around April 2024.  He did not require hospitalization but had recurrent exacerbations of COPD since then requiring full rounds of antibiotic and prednisone.  Chest x-ray did not show any evidence of pneumonia Currently at prednisone at 10 mg/day and continues to have significant daily cough, chest congestion, wheezing  Recently diagnosed with Lewy body dementia and follows with neurology at [facility].  Outpatient Encounter Medications as of 09/26/2022  Medication Sig   albuterol (PROVENTIL) (2.5 MG/3ML) 0.083% nebulizer solution Take 3 mLs (2.5 mg total) by nebulization every 6 (six) hours as needed for wheezing or shortness of breath.   albuterol (VENTOLIN HFA) 108 (90 Base) MCG/ACT inhaler Inhale 2 puffs into the lungs every 4 (four) hours as needed.   benzonatate (TESSALON) 200 MG capsule Take 1 capsule (200 mg total) by mouth 3 (three) times daily as needed.   CINNAMON PO Take 1 tablet by mouth  daily.   Fluticasone-Umeclidin-Vilant (TRELEGY ELLIPTA) 200-62.5-25 MCG/ACT AEPB Inhale 1 puff into the lungs daily.   Ginger, Zingiber officinalis, (GINGER PO) Take 1 tablet by mouth daily.   guaiFENesin (MUCINEX) 600 MG 12 hr tablet Take 600 mg by mouth 2 (two) times daily.   loratadine (CLARITIN) 10 MG tablet Take 10 mg by mouth daily.   MAGNESIUM PO Take 1 tablet by mouth daily.   Multiple Vitamins-Minerals (ZINC PO) Take 1 tablet by mouth daily.   Omega-3 Fatty Acids (FISH OIL PO) Take 1 tablet by mouth daily.   predniSONE (DELTASONE) 10 MG tablet Take 1 tablet (10 mg total) by mouth daily with breakfast. TAKE 2 TABLETS BY MOUTH DAILY FOR 7 DAYS THEN TAKE 1 TABLET BY MOUTH DAILY   TURMERIC PO Take 1 tablet by mouth daily.   [DISCONTINUED] meloxicam (MOBIC) 15 MG tablet Take 7.5 mg by mouth daily.   No facility-administered encounter medications on file as of 09/26/2022.    Physical Exam: Blood pressure 124/62, pulse 81, temperature 98.2 F (36.8 C), temperature source Oral, height 5\' 7"  (1.702 m), weight 202 lb 6.4 oz (91.8 kg), SpO2 92%. Gen:      No acute distress HEENT:  EOMI, sclera anicteric Neck:     No masses; no thyromegaly Lungs:    Bilateral congestion, scattered wheeze CV:         Regular rate and rhythm; no murmurs Abd:      + bowel sounds; soft, non-tender; no palpable masses, no distension Ext:    No edema; adequate peripheral perfusion Skin:  Warm and dry; no rash Neuro: alert and oriented x 3 Psych: normal mood and affect   Data Reviewed: Imaging: CT high-resolution 11/01/2016-emphysema, 1 cm right lower lobe pulmonary nodule, dilated pulmonary artery CT chest 05/31/2017-resolution of right lower lobe nodule. Screening CT chest 06/22/2022-paraseptal and centrilobular emphysema, stable lung nodule. Chest x-ray 08/10/2022-no acute cardiopulmonary disease I have reviewed the images personally.  PFTs: 08/22/2017 FVC 1.9 [43%], FEV1 0.9 [26%], F/F 48 Very severe  obstruction.  05/13/2021 FVC 2.23 [52%], FEV1 0.88 [26%], F/F39, TLC 4.75 [76%], DLCO 15.08 [60%] Severe obstructive airway disease with bronchodilator response, moderate restriction, diffusion defect  Labs: CBC 9/40/21-WBC 9.2, eos 2.5%, absolute eosinophil count 230 IgE 11/26/2018 1-31 Alpha-1 antitrypsin 11/26/2019-139, PI MM  Cardiac: Echocardiogram 11/08/2016-LVEF 65 to 70%, grade 1 diastolic dysfunction, PA peak pressure of 45  Assessment:  Severe COPD with exacerbation Continue on Trelegy inhaler He continues to have significant symptoms of dyspnea and hard to control exacerbation I do not believe he will require additional antibiotics as he is already received 4 courses Increase prednisone to 20 mg/day Change albuterol neb to DuoNebs Continue Tessalon, Delsym Encouraged to use flutter valve every day with Mucinex Start chronic azithromycin at 250 mg 3 times per week Referral to pulmonary rehab at [facility] Continue low-dose screening CT chest  Health maintenance He has COVID-19 vaccine hesitancy.  Discussed with patient and wife and encouraged him strongly to get the vaccine He is up-to-date with flu and pneumonia vaccine  Plan/Recommendations: Continue Trelegy DuoNebs, flutter valve, prednisone increased to 20 mg/day Start chronic azithromycin 3 times per week Referral to pulmonary rehab.  Chilton Greathouse MD [facility] 09/26/2022, 8:37 AM  CC: Jeff Found, MD

## 2022-09-26 NOTE — Patient Instructions (Addendum)
I am sorry you are not feeling well Will change albuterol nebs to DuoNebs every 6 hours as needed Continue using the flutter wall Will increase prednisone to 20 mg a day.  Hold at that dose until seen back in clinic Start azithromycin to 250 mg 3 times/week  Follow-up in 3 months

## 2022-10-03 ENCOUNTER — Ambulatory Visit: Payer: 59 | Admitting: Adult Health

## 2022-10-04 ENCOUNTER — Ambulatory Visit: Payer: 59 | Admitting: Adult Health

## 2022-10-19 DIAGNOSIS — J9611 Chronic respiratory failure with hypoxia: Secondary | ICD-10-CM | POA: Diagnosis not present

## 2022-11-19 DIAGNOSIS — J9611 Chronic respiratory failure with hypoxia: Secondary | ICD-10-CM | POA: Diagnosis not present

## 2022-11-28 ENCOUNTER — Other Ambulatory Visit: Payer: Self-pay | Admitting: Adult Health

## 2022-12-14 DIAGNOSIS — R350 Frequency of micturition: Secondary | ICD-10-CM | POA: Diagnosis not present

## 2022-12-14 DIAGNOSIS — N529 Male erectile dysfunction, unspecified: Secondary | ICD-10-CM | POA: Diagnosis not present

## 2022-12-14 DIAGNOSIS — F03A2 Unspecified dementia, mild, with psychotic disturbance: Secondary | ICD-10-CM | POA: Diagnosis not present

## 2022-12-14 DIAGNOSIS — F03A0 Unspecified dementia, mild, without behavioral disturbance, psychotic disturbance, mood disturbance, and anxiety: Secondary | ICD-10-CM | POA: Insufficient documentation

## 2022-12-19 DIAGNOSIS — J9611 Chronic respiratory failure with hypoxia: Secondary | ICD-10-CM | POA: Diagnosis not present

## 2023-01-04 ENCOUNTER — Other Ambulatory Visit: Payer: Self-pay | Admitting: Adult Health

## 2023-01-05 ENCOUNTER — Telehealth: Payer: Self-pay | Admitting: Pulmonary Disease

## 2023-01-05 NOTE — Telephone Encounter (Signed)
Patient needs a refill of Trelegy. Wife states that pharmacy([facility] in [place]) faxed over a prescription form 2 days ago to be filled out.

## 2023-01-06 MED ORDER — TRELEGY ELLIPTA 200-62.5-25 MCG/ACT IN AEPB: 1.0000 | INHALATION_SPRAY | Freq: Every day | RESPIRATORY_TRACT | 1 refills | Status: DC

## 2023-01-06 NOTE — Telephone Encounter (Signed)
RX sent

## 2023-01-19 ENCOUNTER — Ambulatory Visit: Payer: 59 | Admitting: Primary Care

## 2023-01-19 ENCOUNTER — Encounter: Payer: Self-pay | Admitting: Primary Care

## 2023-01-19 VITALS — BP 138/85 | HR 61 | Ht 67.0 in | Wt 209.6 lb

## 2023-01-19 DIAGNOSIS — J449 Chronic obstructive pulmonary disease, unspecified: Secondary | ICD-10-CM | POA: Diagnosis not present

## 2023-01-19 DIAGNOSIS — Z79899 Other long term (current) drug therapy: Secondary | ICD-10-CM | POA: Diagnosis not present

## 2023-01-19 DIAGNOSIS — J9611 Chronic respiratory failure with hypoxia: Secondary | ICD-10-CM | POA: Diagnosis not present

## 2023-01-19 MED ORDER — AZELASTINE HCL 0.1 % NA SOLN: 2.0000 | Freq: Two times a day (BID) | NASAL | 1 refills | Status: AC

## 2023-01-19 NOTE — Patient Instructions (Addendum)
Recommendations:  Continue Trelegy 1 puff daily Continue azithromycin on Monday Wednesday Friday Continue ipratropium Albuterol nebulizer every 6 hours as needed Continue 2 to 3 L of supplemental oxygen to maintain O2 greater than 88 to 90% Continue to use wedge pillow at night while sleeping Continue Mucinex twice daily Use flutter valve 2-3 times a day Due for annual CT chest in April 2025 By office if and when you are open to attending pulmonary rehab  Orders: EKG re: medication management/ COPD (chronic azithromycin)  Rx: Astelin nasal spray   Follow-up: 3-4 months in [place] with Beth or Tammy or soon    Respiratory Syncytial Virus (RSV) Vaccine Injection What is this medication? RESPIRATORY SYNCYTIAL VIRUS VACCINE (reh SPIR uh tor ee sin SISH uhl VY rus vak SEEN) reduces the risk of respiratory syncytial virus (RSV). It does not treat RSV. It is still possible to get RSV after receiving this vaccine, but the symptoms may be less severe or not last as long. It works by helping your immune system learn how to fight off a future infection. This medicine may be used for other purposes; ask your health care provider or pharmacist if you have questions. COMMON BRAND NAME(S): ABRYSVO, AREXVY What should I tell my care team before I take this medication? They need to know if you have any of these conditions: Immune system problems An unusual or allergic reaction to respiratory syncytial virus vaccine, other medications, foods, dyes, or preservatives Pregnant or trying to get pregnant Breastfeeding How should I use this medication? This vaccine is injected into a muscle. It is given by your care team. A copy of Vaccine Information Statements will be given before each vaccination. Be sure to read this information carefully each time. This sheet may change often. A copy of Vaccine Information Statements will be given before each vaccination. Be sure to read this information  carefully each time. This sheet may change often. Talk to your care team about the use of this vaccine in children. It is not approved for use in children. Overdosage: If you think you have taken too much of this medicine contact a poison control center or emergency room at once. NOTE: This medicine is only for you. Do not share this medicine with others. What if I miss a dose? This does not apply. What may interact with this medication? Medications that lower your chance of fighting infection This list may not describe all possible interactions. Give your health care provider a list of all the medicines, herbs, non-prescription drugs, or dietary supplements you use. Also tell them if you smoke, drink alcohol, or use illegal drugs. Some items may interact with your medicine. What should I watch for while using this medication? Visit your care team for regular health checks. Before you receive this vaccine, talk to your care team if you have an acute illness. Vaccines can be given to people with mild acute illness, such as the common cold or diarrhea. Discuss with your care team the risks and benefits of receiving this vaccine during a moderate to severe illness. Your care team may choose to wait to give you the vaccine when you feel better. Report any side effects to your care team or to the Vaccine Adverse Event Reporting System (VAERS) website at https://vaers.LAgents.no. This is only for reporting side effects; VAERs staff do not give medical advice. What side effects may I notice from receiving this medication? Side effects that you should report to your care team as soon  as possible: Allergic reactions--skin rash, itching, hives, swelling of the face, lips, tongue, or throat Side effects that usually do not require medical attention (report these to your care team if they continue or are bothersome): Fatigue Feeling faint or lightheaded Headache Joint pain Muscle pain Pain, redness, or  irritation at injection site This list may not describe all possible side effects. Call your doctor for medical advice about side effects. You may report side effects to FDA at 1-800-FDA-1088. Where should I keep my medication? This vaccine is only given by your care team. It will not be stored at home. NOTE: This sheet is a summary. It may not cover all possible information. If you have questions about this medicine, talk to your doctor, pharmacist, or health care provider.  2024 Elsevier/Gold Standard (2022-03-03 00:00:00)  Influenza Vaccine Injection What is this medication? INFLUENZA VACCINE (in floo EN zuh vak SEEN) reduces the risk of the influenza (flu). It does not treat influenza. It is still possible to get influenza after receiving this vaccine, but the symptoms may be less severe or not last as long. It works by helping your immune system learn how to fight off a future infection. This medicine may be used for other purposes; ask your health care provider or pharmacist if you have questions. COMMON BRAND NAME(S): Afluria Quadrivalent, FLUAD Quadrivalent, Fluarix Quadrivalent, Flublok Quadrivalent, FLUCELVAX Quadrivalent, Flulaval Quadrivalent, Fluzone Quadrivalent What should I tell my care team before I take this medication? They need to know if you have any of these conditions: Bleeding disorder like hemophilia Fever or infection Guillain-Barre syndrome or other neurological problems Immune system problems Infection with the human immunodeficiency virus (HIV) or AIDS Low blood platelet counts Multiple sclerosis An unusual or allergic reaction to influenza virus vaccine, latex, other medications, foods, dyes, or preservatives. Different brands of vaccines contain different allergens. Some may contain latex or eggs. Talk to your care team about your allergies to make sure that you get the right vaccine. Pregnant or trying to get pregnant Breastfeeding How should I use this  medication? This vaccine is injected into a muscle or under the skin. It is given by your care team. A copy of Vaccine Information Statements will be given before each vaccination. Be sure to read this sheet carefully each time. This sheet may change often. Talk to your care team to see which vaccines are right for you. Some vaccines should not be used in all age groups. Overdosage: If you think you have taken too much of this medicine contact a poison control center or emergency room at once. NOTE: This medicine is only for you. Do not share this medicine with others. What if I miss a dose? This does not apply. What may interact with this medication? Certain medications that lower your immune system, such as etanercept, anakinra, infliximab, adalimumab Certain medications that prevent or treat blood clots, such as warfarin Chemotherapy or radiation therapy Phenytoin Steroid medications, such as prednisone or cortisone Theophylline Vaccines This list may not describe all possible interactions. Give your health care provider a list of all the medicines, herbs, non-prescription drugs, or dietary supplements you use. Also tell them if you smoke, drink alcohol, or use illegal drugs. Some items may interact with your medicine. What should I watch for while using this medication? Report any side effects that do not go away with your care team. Call your care team if any unusual symptoms occur within 6 weeks of receiving this vaccine. You may still catch the  flu, but the illness is not usually as bad. You cannot get the flu from the vaccine. The vaccine will not protect against colds or other illnesses that may cause fever. The vaccine is needed every year. What side effects may I notice from receiving this medication? Side effects that you should report to your care team as soon as possible: Allergic reactions--skin rash, itching, hives, swelling of the face, lips, tongue, or throat Side effects  that usually do not require medical attention (report these to your care team if they continue or are bothersome): Chills Fatigue Headache Joint pain Loss of appetite Muscle pain Nausea Pain, redness, or irritation at injection site This list may not describe all possible side effects. Call your doctor for medical advice about side effects. You may report side effects to FDA at 1-800-FDA-1088. Where should I keep my medication? The vaccine is only given by your care team. It will not be stored at home. NOTE: This sheet is a summary. It may not cover all possible information. If you have questions about this medicine, talk to your doctor, pharmacist, or health care provider.  2024 Elsevier/Gold Standard (2021-08-10 00:00:00)

## 2023-01-19 NOTE — Progress Notes (Signed)
@Patient  ID: Jeff Axon., male    DOB: Jan 16, 1966, 57 y.o.   MRN: [id number]  Chief Complaint  Patient presents with   Follow-up    Pts wife states he is doing well. No concerns     Referring provider: Assunta Found, MD  HPI: 57 y.o.  with severe COPD, mild secondary pulmonary hypertension, heavy ex-smoker.  Was initially maintained on Anoro.  Inhalers changed to [facility] in April 2021 by his primary care.  Patient feels that the new inhaler works better Complains of chronic cough with white mucus, dyspnea on exertion at baseline.  Denies any snoring, excessive daytime sleepiness.  Currently on nocturnal oxygen.  Pets: 2 dogs, no birds Occupation: Works as a Energy manager.  Repairs boat motors Exposures: No known exposures.  No mold, hot tub, Jacuzzi.  No feather pillows or comforters Smoking history: 40-60-pack-year smoker.  Quit in 2020 Travel history: No significant travel history Relevant family history: Mom had COPD.  She was a smoker  09/26/22- Dr. Isaiah Serge Developed COVID-19 around April 2024.  He did not require hospitalization but had recurrent exacerbations of COPD since then requiring full rounds of antibiotic and prednisone.  Chest x-ray did not show any evidence of pneumonia Currently at prednisone at 10 mg/day and continues to have significant daily cough, chest congestion, wheezing  Recently diagnosed with Lewy body dementia and follows with neurology at [facility].   01/19/2023- interim hx  Discussed the use of AI scribe software for clinical note transcription with the patient, who gave verbal consent to proceed.  History of Present Illness   The patient, with a history of chronic obstructive pulmonary disease (COPD), was last seen in July for a flare-up of his condition. At that time, his prednisone was increased, and he was started on azithromycin 250mg  three times per week. He was also referred to pulmonary rehab and advised to use a flutter valve with  Mucinex. His nebulizer was changed to ipratropium albuterol, and he continued on Trelegy.  Since the last visit, the patient reports significant improvement in his symptoms. He has been adhering to the nebulizer treatments two to three times a day, which he feels has been beneficial. He continues to use Trelegy once a day. He also reports compliance with the thrice-weekly azithromycin regimen.  The patient notes that his breathing has improved, and his coughing has significantly reduced. He does, however, experience intermittent wheezing, which seems to worsen on days he is not on the antibiotic. He also reports a runny nose, particularly in the mornings, which he attributes to possible allergies.  The patient has not been using the flutter valve regularly but has purchased a wedge pillow to help with nighttime elevation, which he reports has been beneficial. He wears two to three liters of oxygen at night. He has not been able to attend pulmonary rehab due to recent surgery and ongoing recovery.  When he does cough, he occasionally brings up sputum, which is described as white or beige in color. He denies any green, yellow, or brown sputum. The patient has not been on chronic prednisone since the last flare-up. He is due for a CT scan in April as part of a lung cancer screening program.         No Known Allergies  Immunization History  Administered Date(s) Administered   Influenza Whole 01/28/2020   Influenza,inj,Quad PF,6+ Mos 12/28/2015, 12/26/2016, 12/27/2017   Influenza-Unspecified 11/12/2020    Past Medical History:  Diagnosis Date   Asthma  COPD (chronic obstructive pulmonary disease) (HCC)    Nocturnal hypoxemia     Tobacco History: Social History   Tobacco Use  Smoking Status Former   Current packs/day: 0.00   Average packs/day: 1 pack/day for 58.0 years (58.0 ttl pk-yrs)   Types: Cigarettes   Start date: 06/13/1982   Quit date: 06/12/2017   Years since quitting: 5.6   Smokeless Tobacco Never   Counseling given: Not Answered   Outpatient Medications Prior to Visit  Medication Sig Dispense Refill   albuterol (VENTOLIN HFA) 108 (90 Base) MCG/ACT inhaler Inhale 2 puffs into the lungs every 4 (four) hours as needed.     azithromycin (ZITHROMAX) 250 MG tablet Take 1 tablet (250 mg total) by mouth 3 (three) times a week. 12 tablet 5   benzonatate (TESSALON) 200 MG capsule TAKE 1 CAPSULE BY MOUTH THREE TIMES A DAY AS NEEDED 45 capsule 5   CINNAMON PO Take 1 tablet by mouth daily.     Fluticasone-Umeclidin-Vilant (TRELEGY ELLIPTA) 200-62.5-25 MCG/ACT AEPB Inhale 1 puff into the lungs daily. 60 each 11   Fluticasone-Umeclidin-Vilant (TRELEGY ELLIPTA) 200-62.5-25 MCG/ACT AEPB Inhale 1 puff into the lungs daily. 60 each 1   Ginger, Zingiber officinalis, (GINGER PO) Take 1 tablet by mouth daily.     guaiFENesin (MUCINEX) 600 MG 12 hr tablet Take 600 mg by mouth 2 (two) times daily.     ipratropium-albuterol (DUONEB) 0.5-2.5 (3) MG/3ML SOLN Take 3 mLs by nebulization every 6 (six) hours as needed. 360 mL 11   loratadine (CLARITIN) 10 MG tablet Take 10 mg by mouth daily.     MAGNESIUM PO Take 1 tablet by mouth daily.     Multiple Vitamins-Minerals (ZINC PO) Take 1 tablet by mouth daily.     Omega-3 Fatty Acids (FISH OIL PO) Take 1 tablet by mouth daily.     TURMERIC PO Take 1 tablet by mouth daily.     predniSONE (DELTASONE) 10 MG tablet Take 1 tablet (10 mg total) by mouth daily with breakfast. TAKE 2 TABLETS BY MOUTH DAILY FOR 7 DAYS THEN TAKE 1 TABLET BY MOUTH DAILY (Patient not taking: Reported on 01/19/2023) 30 tablet 3   No facility-administered medications prior to visit.   Review of Systems  Review of Systems  Constitutional: Negative.   HENT:  Positive for postnasal drip.   Respiratory:  Positive for cough and wheezing. Negative for shortness of breath.   Cardiovascular: Negative.     Physical Exam  BP 138/85   Pulse 61   Ht 5\' 7"  (1.702 m)   Wt  209 lb 9.6 oz (95.1 kg)   SpO2 91%   BMI 32.83 kg/m  Physical Exam Constitutional:      Appearance: Normal appearance.  HENT:     Head: Normocephalic and atraumatic.  Cardiovascular:     Rate and Rhythm: Normal rate and regular rhythm.  Pulmonary:     Effort: Pulmonary effort is normal.     Breath sounds: Wheezing present.     Comments: Wheezing left lung; O2 3L POC Musculoskeletal:        General: Normal range of motion.  Skin:    General: Skin is warm and dry.  Neurological:     General: No focal deficit present.     Mental Status: He is alert and oriented to person, place, and time. Mental status is at baseline.      Lab Results:  CBC    Component Value Date/Time   WBC 7.5 08/10/2022 1028  RBC 4.20 (L) 08/10/2022 1028   HGB 12.7 (L) 08/10/2022 1028   HCT 39.5 08/10/2022 1028   PLT 226.0 08/10/2022 1028   MCV 93.9 08/10/2022 1028   MCHC 32.1 08/10/2022 1028   RDW 14.6 08/10/2022 1028   LYMPHSABS 2.0 08/10/2022 1028   MONOABS 0.5 08/10/2022 1028   EOSABS 0.4 08/10/2022 1028   BASOSABS 0.1 08/10/2022 1028    BMET    Component Value Date/Time   NA 140 08/10/2022 1028   K 4.0 08/10/2022 1028   CL 101 08/10/2022 1028   CO2 35 (H) 08/10/2022 1028   GLUCOSE 170 (H) 08/10/2022 1028   BUN 19 08/10/2022 1028   CREATININE 0.69 08/10/2022 1028   CALCIUM 8.9 08/10/2022 1028    BNP No results found for: "BNP"  ProBNP    Component Value Date/Time   PROBNP 22.0 08/10/2022 1028    Imaging: No results found.   Assessment & Plan:   1. Medication management - EKG 12-Lead  2. COPD, very severe (HCC)    Chronic Obstructive Pulmonary Disease (COPD) Improvement in symptoms following changes in treatment regimen in July. Currently on Trelegy once daily, nebulizer treatments with ipratropium albuterol 2-3 times daily, and azithromycin 250mg  three times per week (Monday, Wednesday, Friday). -Continue current treatment regimen. -Consider use of flutter valve and  Mucinex as needed for congestion. -Needs EKG to monitor QTC interval due to chronic macrolide  -Consider pulmonary rehab when patient feels ready. -Due for CT scan in April 2025 for lung cancer screening. -Consider flu shot and RSV vaccine; provide patient with information for review. -Follow-up in 3-4 months in [place] office.   Chronic respiratory failure - Continue 2 to 3 L of supplemental oxygen to maintain O2 greater than 88 to 90% - Continue to use wedge pillow at night while sleeping  Allergic Rhinitis Reports of morning nasal congestion and runny nose, possibly due to allergies. -Prescribe Astelin (azelastine) nasal spray for use as needed.   Glenford Bayley, NP 01/19/2023

## 2023-02-02 ENCOUNTER — Ambulatory Visit: Payer: 59 | Admitting: Adult Health

## 2023-02-18 DIAGNOSIS — J9611 Chronic respiratory failure with hypoxia: Secondary | ICD-10-CM | POA: Diagnosis not present

## 2023-03-21 DIAGNOSIS — J9611 Chronic respiratory failure with hypoxia: Secondary | ICD-10-CM | POA: Diagnosis not present

## 2023-04-03 ENCOUNTER — Other Ambulatory Visit: Payer: Self-pay | Admitting: Adult Health

## 2023-04-15 ENCOUNTER — Other Ambulatory Visit: Payer: Self-pay | Admitting: Adult Health

## 2023-04-17 ENCOUNTER — Telehealth: Payer: Self-pay | Admitting: Primary Care

## 2023-04-17 NOTE — Telephone Encounter (Signed)
Patient needs refill of Trelegy.  Pharmacy: [facility] in [place]

## 2023-04-18 ENCOUNTER — Other Ambulatory Visit: Payer: Self-pay | Admitting: Pulmonary Disease

## 2023-04-18 MED ORDER — TRELEGY ELLIPTA 200-62.5-25 MCG/ACT IN AEPB: 1.0000 | INHALATION_SPRAY | Freq: Every day | RESPIRATORY_TRACT | 3 refills | Status: DC

## 2023-04-18 NOTE — Telephone Encounter (Signed)
Jeff Cruz wife checking on message for medication. Patient is out of medication. Jeff Cruz phone number is [phone].

## 2023-04-18 NOTE — Telephone Encounter (Signed)
Trelegy has been sent to preferred pharmacy.  Patient's spouse, Renee(DPR) is aware and voiced her understanding.  Nothing further needed.

## 2023-04-21 ENCOUNTER — Other Ambulatory Visit: Payer: Self-pay | Admitting: Pulmonary Disease

## 2023-04-21 DIAGNOSIS — J9611 Chronic respiratory failure with hypoxia: Secondary | ICD-10-CM | POA: Diagnosis not present

## 2023-04-25 ENCOUNTER — Telehealth: Payer: Self-pay | Admitting: Primary Care

## 2023-04-25 ENCOUNTER — Other Ambulatory Visit: Payer: Self-pay | Admitting: Adult Health

## 2023-04-25 NOTE — Telephone Encounter (Signed)
Renee wife states patient needs refill for Albuterol solution. Pharmacy is [facility] [place]. Renee phone number is [phone].

## 2023-04-27 NOTE — Telephone Encounter (Signed)
Albuterol solution was refilled today. Nothing further needed.

## 2023-05-01 ENCOUNTER — Ambulatory Visit (HOSPITAL_COMMUNITY)
Admission: RE | Admit: 2023-05-01 | Discharge: 2023-05-01 | Disposition: A | Discharge status: Home / Self Care | Payer: 59 | Source: Ambulatory Visit | Attending: Primary Care | Admitting: Primary Care

## 2023-05-01 ENCOUNTER — Ambulatory Visit: Payer: 59 | Admitting: Primary Care

## 2023-05-01 VITALS — BP 143/79 | HR 82 | Ht 67.0 in | Wt 209.0 lb

## 2023-05-01 DIAGNOSIS — J209 Acute bronchitis, unspecified: Secondary | ICD-10-CM | POA: Diagnosis not present

## 2023-05-01 DIAGNOSIS — J441 Chronic obstructive pulmonary disease with (acute) exacerbation: Secondary | ICD-10-CM

## 2023-05-01 MED ORDER — ALBUTEROL SULFATE (2.5 MG/3ML) 0.083% IN NEBU: 3.0000 mL | INHALATION_SOLUTION | Freq: Four times a day (QID) | RESPIRATORY_TRACT | 5 refills | Status: DC | PRN

## 2023-05-01 MED ORDER — DOXYCYCLINE HYCLATE 100 MG PO TABS: 100.0000 mg | ORAL_TABLET | Freq: Two times a day (BID) | ORAL | 0 refills | Status: DC

## 2023-05-01 MED ORDER — BENZONATATE 200 MG PO CAPS: 200.0000 mg | ORAL_CAPSULE | Freq: Two times a day (BID) | ORAL | 5 refills | Status: AC | PRN

## 2023-05-01 MED ORDER — TRELEGY ELLIPTA 200-62.5-25 MCG/ACT IN AEPB: 1.0000 | INHALATION_SPRAY | Freq: Every day | RESPIRATORY_TRACT | 11 refills | Status: DC

## 2023-05-01 NOTE — Progress Notes (Signed)
@Patient  ID: Jeff Axon., male    DOB: 02-21-1966, 58 y.o.   MRN: [id number]  Chief Complaint  Patient presents with   COPD    Referring provider: Assunta Found, MD  HPI: 58 y.o.  with severe COPD, mild secondary pulmonary hypertension, heavy ex-smoker.  Was initially maintained on Anoro.  Inhalers changed to [facility] in April 2021 by his primary care.  Patient feels that the new inhaler works better Complains of chronic cough with white mucus, dyspnea on exertion at baseline.  Denies any snoring, excessive daytime sleepiness.  Currently on nocturnal oxygen.  Pets: 2 dogs, no birds Occupation: Works as a Energy manager.  Repairs boat motors Exposures: No known exposures.  No mold, hot tub, Jacuzzi.  No feather pillows or comforters Smoking history: 40-60-pack-year smoker.  Quit in 2020 Travel history: No significant travel history Relevant family history: Mom had COPD.  She was a smoker  09/26/22- Dr. Isaiah Serge Developed COVID-19 around April 2024.  He did not require hospitalization but had recurrent exacerbations of COPD since then requiring full rounds of antibiotic and prednisone.  Chest x-ray did not show any evidence of pneumonia Currently at prednisone at 10 mg/day and continues to have significant daily cough, chest congestion, wheezing  Recently diagnosed with Lewy body dementia and follows with neurology at [facility].   05/01/2023 Discussed the use of AI scribe software for clinical note transcription with the patient, who gave verbal consent to proceed.  History of Present Illness   The patient, with a history of chronic obstructive pulmonary disease (COPD), was last seen in July for a flare-up of his condition. At that time, his prednisone was increased, and he was started on azithromycin 250mg  three times per week. He was also referred to pulmonary rehab and advised to use a flutter valve with Mucinex. His nebulizer was changed to ipratropium albuterol, and he  continued on Trelegy.  Since the last visit, the patient reports significant improvement in his symptoms. He has been adhering to the nebulizer treatments two to three times a day, which he feels has been beneficial. He continues to use Trelegy once a day. He also reports compliance with the thrice-weekly azithromycin regimen.  The patient notes that his breathing has improved, and his coughing has significantly reduced. He does, however, experience intermittent wheezing, which seems to worsen on days he is not on the antibiotic. He also reports a runny nose, particularly in the mornings, which he attributes to possible allergies.  The patient has not been using the flutter valve regularly but has purchased a wedge pillow to help with nighttime elevation, which he reports has been beneficial. He wears two to three liters of oxygen at night. He has not been able to attend pulmonary rehab due to recent surgery and ongoing recovery.  When he does cough, he occasionally brings up sputum, which is described as white or beige in color. He denies any green, yellow, or brown sputum. The patient has not been on chronic prednisone since the last flare-up. He is due for a CT scan in April as part of a lung cancer screening program.      05/01/2023- interim hx  Discussed the use of AI scribe software for clinical note transcription with the patient, who gave verbal consent to proceed.  History of Present Illness   Jeff Axon. "Jeff Cruz is a 58 year old male who presents for follow-up COPD and chronic respiratory failure   He reprots having increased chest  congestion and rattling in his chest last several weeks, symptoms are present throughout the day. He sleeps with his elevated at night. He continues to use Trelegy and a nebulizer followed by a flutter valve. His prescription for the nebulizer and Trelegy ran out, but he was able to get a refill because of his upcoming appointment.       No Known  Allergies  Immunization History  Administered Date(s) Administered   Influenza Whole 01/28/2020   Influenza,inj,Quad PF,6+ Mos 12/28/2015, 12/26/2016, 12/27/2017   Influenza-Unspecified 11/12/2020    Past Medical History:  Diagnosis Date   Asthma    COPD (chronic obstructive pulmonary disease) (HCC)    Nocturnal hypoxemia     Tobacco History: Social History   Tobacco Use  Smoking Status Former   Current packs/day: 0.00   Average packs/day: 1 pack/day for 35.0 years (35.0 ttl pk-yrs)   Types: Cigarettes   Start date: 06/13/1982   Quit date: 06/12/2017   Years since quitting: 5.8  Smokeless Tobacco Never   Counseling given: Not Answered   Outpatient Medications Prior to Visit  Medication Sig Dispense Refill   albuterol (PROVENTIL) (2.5 MG/3ML) 0.083% nebulizer solution Take 3 mLs (2.5 mg total) by nebulization every 6 (six) hours as needed for wheezing or shortness of breath. 75 mL 5   albuterol (VENTOLIN HFA) 108 (90 Base) MCG/ACT inhaler Inhale 2 puffs into the lungs every 4 (four) hours as needed.     ALPRAZolam (XANAX) 0.5 MG tablet Take 0.5 mg by mouth 3 (three) times daily.     azelastine (ASTELIN) 0.1 % nasal spray Place 2 sprays into both nostrils 2 (two) times daily. Use in each nostril as directed 30 mL 1   azithromycin (ZITHROMAX) 250 MG tablet Take 1 tablet (250 mg total) by mouth 3 (three) times a week. 12 tablet 5   benzonatate (TESSALON) 200 MG capsule TAKE 1 CAPSULE BY MOUTH THREE TIMES A DAY AS NEEDED 45 capsule 5   CINNAMON PO Take 1 tablet by mouth daily.     donepezil (ARICEPT) 10 MG tablet Take 10 mg by mouth daily.     ELDERBERRY PO Take by mouth. SYRUP PURCHASED LOCALLY     Fluticasone-Umeclidin-Vilant (TRELEGY ELLIPTA) 200-62.5-25 MCG/ACT AEPB Inhale 1 puff into the lungs daily. 60 each 3   Ginger, Zingiber officinalis, (GINGER PO) Take 1 tablet by mouth daily.     guaiFENesin (MUCINEX) 600 MG 12 hr tablet Take 600 mg by mouth 2 (two) times daily.      ipratropium-albuterol (DUONEB) 0.5-2.5 (3) MG/3ML SOLN Take 3 mLs by nebulization every 6 (six) hours as needed. 360 mL 11   loratadine (CLARITIN) 10 MG tablet Take 10 mg by mouth daily.     MAGNESIUM PO Take 1 tablet by mouth daily.     Multiple Vitamins-Minerals (ZINC PO) Take 1 tablet by mouth daily.     Omega-3 Fatty Acids (FISH OIL PO) Take 1 tablet by mouth daily.     QUEtiapine (SEROQUEL) 25 MG tablet Take 25 mg by mouth 2 (two) times daily.     TURMERIC PO Take 1 tablet by mouth daily.     Fluticasone-Umeclidin-Vilant (TRELEGY ELLIPTA) 200-62.5-25 MCG/ACT AEPB INHALE ONE PUFF INTO THE LUNGS DAILY. 60 each 11   No facility-administered medications prior to visit.   Review of Systems  Review of Systems  Constitutional: Negative.   HENT:  Positive for congestion and postnasal drip.   Respiratory:  Positive for cough and wheezing. Negative for shortness of  breath.   Cardiovascular: Negative.     Physical Exam  BP (!) 143/79   Pulse 82   Ht 5\' 7"  (1.702 m)   Wt 209 lb (94.8 kg)   SpO2 94% Comment: 2L CONT  BMI 32.73 kg/m  Physical Exam Constitutional:      Appearance: Normal appearance.  HENT:     Head: Normocephalic and atraumatic.  Cardiovascular:     Rate and Rhythm: Normal rate and regular rhythm.  Pulmonary:     Effort: Pulmonary effort is normal.     Breath sounds: Wheezing and rhonchi present.     Comments: O2 2-3L POC Musculoskeletal:        General: Normal range of motion.  Skin:    General: Skin is warm and dry.  Neurological:     General: No focal deficit present.     Mental Status: He is alert and oriented to person, place, and time. Mental status is at baseline.      Lab Results:  CBC    Component Value Date/Time   WBC 7.5 08/10/2022 1028   RBC 4.20 (L) 08/10/2022 1028   HGB 12.7 (L) 08/10/2022 1028   HCT 39.5 08/10/2022 1028   PLT 226.0 08/10/2022 1028   MCV 93.9 08/10/2022 1028   MCHC 32.1 08/10/2022 1028   RDW 14.6 08/10/2022 1028    LYMPHSABS 2.0 08/10/2022 1028   MONOABS 0.5 08/10/2022 1028   EOSABS 0.4 08/10/2022 1028   BASOSABS 0.1 08/10/2022 1028    BMET    Component Value Date/Time   NA 140 08/10/2022 1028   K 4.0 08/10/2022 1028   CL 101 08/10/2022 1028   CO2 35 (H) 08/10/2022 1028   GLUCOSE 170 (H) 08/10/2022 1028   BUN 19 08/10/2022 1028   CREATININE 0.69 08/10/2022 1028   CALCIUM 8.9 08/10/2022 1028    BNP No results found for: "BNP"  ProBNP    Component Value Date/Time   PROBNP 22.0 08/10/2022 1028    Imaging: No results found.   Assessment & Plan:   1. Medication management - EKG 12-Lead  2. COPD, very severe (HCC)  Severe COPD with acute exacerbation Increased chest congestion/rattling. Patient has productive cough with wheezing on exam. He is compliant with Trelegy , albuterol nebulizer twice daily and mucinex twice daily. Not currently using flutter valve regularly.  -Order chest x-ray. -Start a new antibiotic once chest x-ray results are available. -Continue Trelegy and nebulizer therapy (refill provided) -Needs EKG to monitor QTC interval due to chronic macrolide use, patient was unable to stay for testing today recommending he have done through PCP or prior to next visit   Acute Respiratory Symptom Management Plan to manage acute symptoms with prednisone taper and increased Mucinex. -Start prednisone taper. -Take Mucinex 1200mg  dose for 5-7 days.  Chronic respiratory failure -No increased oxygen demanad, continue 2-3L supplemental O2 to maintain oxygen saturations >88-90%   Allergic rhinitis -Continue Astelin nasal spray twice daily as needed for PND symptoms   Follow-up in 3-4 months or sooner if needed.            Chronic Obstructive Pulmonary Disease (COPD) Improvement in symptoms following changes in treatment regimen in July. Currently on Trelegy once daily, nebulizer treatments with ipratropium albuterol 2-3 times daily, and azithromycin 250mg  three  times per week (Monday, Wednesday, Friday). -Continue current treatment regimen. -Consider use of flutter valve and Mucinex as needed for congestion. -Needs EKG to monitor QTC interval due to chronic macrolide  -Consider pulmonary rehab  when patient feels ready. -Due for CT scan in April 2025 for lung cancer screening. -Consider flu shot and RSV vaccine; provide patient with information for review. -Follow-up in 3-4 months in [place] office.   Chronic respiratory failure - Continue 2 to 3 L of supplemental oxygen to maintain O2 greater than 88 to 90% - Continue to use wedge pillow at night while sleeping  Allergic Rhinitis Reports of morning nasal congestion and runny nose, possibly due to allergies. -Prescribe Astelin (azelastine) nasal spray for use as needed.   Glenford Bayley, NP 05/01/2023

## 2023-05-01 NOTE — Patient Instructions (Signed)
CXR today Take 1200mg  mucinex twice daily x 5-7 days Continue Trelegy daily Use nebulizer 2-3 time a day followed by flutter valve Take prednisone taper  We will send in abx after CXR comes back (hold azithromycin) Recommend you get an EKG with PCP to monitor QTC internal due to chronic macrolide use or at follow-up with [place]   Follow-up 3 months with EKG / COPD follow-up

## 2023-05-01 NOTE — Addendum Note (Signed)
Addended by: Glenford Bayley on: 05/01/2023 11:02 AM   Modules accepted: Orders

## 2023-05-09 ENCOUNTER — Ambulatory Visit: Payer: Self-pay | Admitting: Pulmonary Disease

## 2023-05-09 NOTE — Telephone Encounter (Addendum)
 Chief Complaint: SOB Symptoms: SOB with exertion Frequency: 1-1.5 weeks Pertinent Negatives: Patient denies fever, CP, URI sx Disposition: [] ED /[] Urgent Care (no appt availability in office) / [x] Appointment(In office/virtual)/ []  [facility]/ [] Home Care/ [] Refused Recommended Disposition /[] [facility]/ []  Follow-up with PCP Additional Notes: Patient wife, Luster Landsberg calling c/o SOB. Patient called wife at work with concern of SOB with exertion. Of note, wife states that he has dementia, so unsure how accurate of a historian he is as she is not home with him. Luster Landsberg does endorse that she has noticed some increased SOB above baseline for about 1.5 weeks and has had an acute Pulm visit on 05/01/23. Renee reports he had CXR and abx and has since finished abx with resolved congestion. Of note, Renee reports that he usually gets prescribed abx with steroids, but steroids were not prescribed the last acute visit. Still with some increased SOB with exertion, and reports taking respiratory tx as prescribed. Pulmonology [facility] Better notified of disposition, patient transferred for scheduling. [place] strongly reinforced evaluation of SOB when caregiver is able to evaluate patient, and strongly advised use of rescue inhaler as needed, and to go to UC if SOB not improved with rescue inhaler/significant SOB above baseline. Caregiver verbalized understanding and to call back/go to UC with worsening symptoms.     Reason for Disposition  [1] MODERATE longstanding difficulty breathing (e.g., speaks in phrases, SOB even at rest, pulse 100-120) AND [2] SAME as normal  Answer Assessment - Initial Assessment Questions E2C2 Pulmonary Triage "Chief Complaint (e.g., cough, sob, wheezing, fever, chills, sweat or additional symptoms) *Go to specific symptom protocol after initial questions. SOB with exertion about an hour ago - wife is calling Of note, acute pulm visit was prescribed abx - finished  already "How long have symptoms been present?" 1-1.5 weeks - wife noticed some increased WOB, but unsure if pt repsonse is accurate, has hx of dementia Have you tested for COVID or Flu? Note: If not, ask patient if a home test can be taken. If so, instruct patient to call back for positive results. No  MEDICINES:   "Have you used any OTC meds to help with symptoms?" Yes If yes, ask "What medications?" Mucinex DM "Have you used your inhalers/maintenance medication?" Yes If yes, "What medications?" Trelegy and albuterol neb If inhaler, ask "How many puffs and how often?" Note: Review instructions on medication in the chart. As prescribed  OXYGEN: "Do you wear supplemental oxygen?" Yes If yes, "How many liters are you supposed to use?" 2.5 L "Do you monitor your oxygen levels?" Yes If yes, "What is your reading (oxygen level) today?" Unknown, reports has dementia "What is your usual oxygen saturation reading?"  (Note: Pulmonary O2 sats should be 90% or greater) With O2 @ 92, without @ 88   1. RESPIRATORY STATUS: "Describe your breathing?" (e.g., wheezing, shortness of breath, unable to speak, severe coughing)      SOB 2. ONSET: "When did this breathing problem begin?"      An hour ago 3. PATTERN "Does the difficult breathing come and go, or has it been constant since it started?"      intermittent 4. SEVERITY: "How bad is your breathing?" (e.g., mild, moderate, severe)    - MILD: No SOB at rest, mild SOB with walking, speaks normally in sentences, can lie down, no retractions, pulse < 100.    - MODERATE: SOB at rest, SOB with minimal exertion and prefers to sit, cannot lie down flat,  speaks in phrases, mild retractions, audible wheezing, pulse 100-120.    - SEVERE: Very SOB at rest, speaks in single words, struggling to breathe, sitting hunched forward, retractions, pulse > 120      Moderate - above baseline 5. RECURRENT SYMPTOM: "Have you had difficulty breathing before?" If Yes,  ask: "When was the last time?" and "What happened that time?"      Was prescribed abx/steroids 6. CARDIAC HISTORY: "Do you have any history of heart disease?" (e.g., heart attack, angina, bypass surgery, angioplasty)      no 7. LUNG HISTORY: "Do you have any history of lung disease?"  (e.g., pulmonary embolus, asthma, emphysema)     no 8. CAUSE: "What do you think is causing the breathing problem?"      COPD exacerbation 9. OTHER SYMPTOMS: "Do you have any other symptoms? (e.g., dizziness, runny nose, cough, chest pain, fever)     Wife denies 10. O2 SATURATION MONITOR:  "Do you use an oxygen saturation monitor (pulse oximeter) at home?" If Yes, ask: "What is your reading (oxygen level) today?" "What is your usual oxygen saturation reading?" (e.g., 95%)       unknown  Protocols used: Breathing Difficulty-A-AH

## 2023-05-10 ENCOUNTER — Encounter: Payer: Self-pay | Admitting: Internal Medicine

## 2023-05-10 ENCOUNTER — Ambulatory Visit: Payer: 59 | Admitting: Internal Medicine

## 2023-05-10 ENCOUNTER — Ambulatory Visit: Payer: 59 | Admitting: Pulmonary Disease

## 2023-05-10 VITALS — BP 138/74 | HR 69 | Ht 67.0 in | Wt 209.6 lb

## 2023-05-10 DIAGNOSIS — J9611 Chronic respiratory failure with hypoxia: Secondary | ICD-10-CM

## 2023-05-10 DIAGNOSIS — R0609 Other forms of dyspnea: Secondary | ICD-10-CM | POA: Insufficient documentation

## 2023-05-10 DIAGNOSIS — Z87891 Personal history of nicotine dependence: Secondary | ICD-10-CM | POA: Diagnosis not present

## 2023-05-10 MED ORDER — FAMOTIDINE 20 MG PO TABS: ORAL_TABLET | ORAL | 11 refills | Status: AC

## 2023-05-10 MED ORDER — CEFDINIR 300 MG PO CAPS: 300.0000 mg | ORAL_CAPSULE | Freq: Two times a day (BID) | ORAL | 0 refills | Status: DC

## 2023-05-10 MED ORDER — BUDESONIDE 0.25 MG/2ML IN SUSP: RESPIRATORY_TRACT | 12 refills | Status: DC

## 2023-05-10 MED ORDER — PANTOPRAZOLE SODIUM 40 MG PO TBEC: 40.0000 mg | DELAYED_RELEASE_TABLET | Freq: Every day | ORAL | 2 refills | Status: DC

## 2023-05-10 MED ORDER — PREDNISONE 10 MG PO TABS: ORAL_TABLET | ORAL | 0 refills | Status: DC

## 2023-05-10 NOTE — Assessment & Plan Note (Addendum)
 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   DDX of  difficult airways management almost all start with A and  include Adherence, Ace Inhibitors, Acid Reflux, Active Sinus Disease, Alpha 1 Antitripsin deficiency, Anxiety masquerading as Airways dz,  ABPA,  Allergy(esp in young), Aspiration (esp in elderly), Adverse effects of meds,  Active smoking or vaping, A bunch of PE's (a small clot burden can't cause this syndrome unless there is already severe underlying pulm or vascular dz with poor reserve) plus two Bs  = Bronchiectasis and Beta blocker use..and one C= CHF  Adherence is always the initial "prime suspect" and is a multilayered concern that requires a "trust but verify" approach in every patient - starting with knowing how to use medications, especially inhalers, correctly, keeping up with refills and understanding the fundamental difference between maintenance and prns vs those medications only taken for a very short course and then stopped and not refilled.  - not able to use trelegy during acute flare > change to neb  - f/u 2 weeks with all meds in hand using a trust but verify approach to confirm accurate Medication  Reconciliation The principal here is that until we are certain that the  patients are doing what we've asked, it makes no sense to ask them to do more.   ? Acid (or non-acid) GERD > always difficult to exclude as up to 75% of pts in some series report no assoc GI/ Heartburn symptoms> rec max (24h)  acid suppression and diet restrictions/ reviewed and instructions given in writing.   ? Active sinusitis > levaquin 500 mg daily x 10 d   ? Adverse  drug effects > try off dpi   ? Allergy > note Eos 0.5/ may be candidate for dupixent > for now rec Prednisone 10 mg take  4 each am x 2 days,   2 each am x 2 days,  1 each am x 2 days and stop   ? Alpha one AT phenotype  MM/  level 149 rules out  D dimer nl - while a   high normal value (seen commonly in the elderly or chronically ill)  may miss small peripheral pe, the clot burden with sob is moderately high and the d dimer  has a very high neg pred value if used in this setting.    ? Chf > with BNP < 50 during symptoms and cxr rule strongly against

## 2023-05-10 NOTE — Progress Notes (Signed)
 Jeff Cruz., male    DOB: [birth year]   MRN: [id number]   Brief patient profile:  57   yowm quit smoking 2023  self referred to pulmonary clinic 05/10/2023 by  for aecopd       PFTs May 13, 2021 showed FEV1 at 26%, ratio 39, FVC 52%, no significant bronchodilator response, DLCO 60%    History of Present Illness  05/10/2023  Pulmonary/ 1st office eval/Zora Glendenning maint on Trelgy  Chief Complaint  Patient presents with   Acute Visit    COPD   Dyspnea:  worse x  3-4 weeks  Cough: green with sensation of choking > no better p doxy/ assoc nasal congesition  Sleep: flat bed / wedge  SABA use: duoneb up to twice daily  02 use:2.5 lpm     No obvious day to day or daytime pattern/variability or assoc   mucus plugs or hemoptysis or cp or chest tightness, subjective wheeze or overt   hb symptoms.    Also denies any obvious fluctuation of symptoms with weather or environmental changes or other aggravating or alleviating factors except as outlined above   No unusual exposure hx or h/o childhood pna/ asthma or knowledge of premature birth.  Current Allergies, Complete Past Medical History, Past Surgical History, Family History, and Social History were reviewed in [employer] record.  ROS  The following are not active complaints unless bolded Hoarseness, sore throat, dysphagia, dental problems, itching, sneezing,  nasal congestion or discharge of excess mucus or purulent secretions, ear ache,   fever, chills, sweats, unintended wt loss or wt gain, classically pleuritic or exertional cp,  orthopnea pnd or arm/hand swelling  or leg swelling, presyncope, palpitations, abdominal pain, anorexia, nausea, vomiting, diarrhea  or change in bowel habits or change in bladder habits, change in stools or change in urine, dysuria, hematuria,  rash, arthralgias, visual complaints, headache, numbness, weakness or ataxia or problems with walking or coordination,  change in mood or  memory.              Outpatient Medications Prior to Visit  Medication Sig Dispense Refill   albuterol (PROVENTIL) (2.5 MG/3ML) 0.083% nebulizer solution Take 3 mLs by nebulization every 6 (six) hours as needed for wheezing or shortness of breath. 75 mL 5   albuterol (VENTOLIN HFA) 108 (90 Base) MCG/ACT inhaler Inhale 2 puffs into the lungs every 4 (four) hours as needed.     ALPRAZolam (XANAX) 0.5 MG tablet Take 0.5 mg by mouth 3 (three) times daily.     azelastine (ASTELIN) 0.1 % nasal spray Place 2 sprays into both nostrils 2 (two) times daily. Use in each nostril as directed 30 mL 1   azithromycin (ZITHROMAX) 250 MG tablet Take 1 tablet (250 mg total) by mouth 3 (three) times a week. 12 tablet 5   benzonatate (TESSALON) 200 MG capsule Take 1 capsule (200 mg total) by mouth 2 (two) times daily as needed for cough. 45 capsule 5   CINNAMON PO Take 1 tablet by mouth daily.     donepezil (ARICEPT) 10 MG tablet Take 10 mg by mouth daily.     doxycycline (VIBRA-TABS) 100 MG tablet Take 1 tablet (100 mg total) by mouth 2 (two) times daily. 14 tablet 0   ELDERBERRY PO Take by mouth. SYRUP PURCHASED LOCALLY     Fluticasone-Umeclidin-Vilant (TRELEGY ELLIPTA) 200-62.5-25 MCG/ACT AEPB Inhale 1 puff into the lungs daily. 60 each 11   Ginger, Zingiber officinalis, (GINGER PO) Take  1 tablet by mouth daily.     guaiFENesin (MUCINEX) 600 MG 12 hr tablet Take 600 mg by mouth 2 (two) times daily.     ipratropium-albuterol (DUONEB) 0.5-2.5 (3) MG/3ML SOLN Take 3 mLs by nebulization every 6 (six) hours as needed. 360 mL 11   loratadine (CLARITIN) 10 MG tablet Take 10 mg by mouth daily.     MAGNESIUM PO Take 1 tablet by mouth daily.     Multiple Vitamins-Minerals (ZINC PO) Take 1 tablet by mouth daily.     Omega-3 Fatty Acids (FISH OIL PO) Take 1 tablet by mouth daily.     QUEtiapine (SEROQUEL) 25 MG tablet Take 25 mg by mouth 2 (two) times daily.     TURMERIC PO Take 1 tablet by mouth daily.     No  facility-administered medications prior to visit.    Past Medical History:  Diagnosis Date   Asthma    COPD (chronic obstructive pulmonary disease) (HCC)    Nocturnal hypoxemia       Objective:     BP 138/74   Pulse 69   Ht 5\' 7"  (1.702 m)   Wt 209 lb 9.6 oz (95.1 kg)   SpO2 93% Comment: pt sat for a few mintues  on 3 L pulse  BMI 32.83 kg/m   SpO2: 93 % (pt sat for a few mintues  on 3 L pulse)  Amb wm comfortable at rest / congested cough   HEENT : Oropharynx  clear   Nasal turbinates nl    NECK :  without  apparent JVD/ palpable Nodes/TM    LUNGS: no acc muscle use,  Mild barrel  contour chest wall with bilateral  insp/exp rhonchi  and  without cough on insp or exp maneuvers  and mild  Hyperresonant  to  percussion bilaterally     CV:  RRR  no s3 or murmur or increase in P2, and no edema   ABD:  soft and nontender with pos end  insp Hoover's  in the supine position.  No bruits or organomegaly appreciated   MS:  Nl gait/ ext warm without deformities Or obvious joint restrictions  calf tenderness, cyanosis or clubbing     SKIN: warm and dry without lesions    NEURO:  alert, approp, nl sensorium with  no motor or cerebellar deficits apparent.      I personally reviewed images and agree with radiology impression as follows:  CXR:   05/01/23 Stable radiographic appearance of the chest consistent with chronic obstructive pulmonary disease. No acute cardiopulmonary process identified.   Labs ordered/ reviewed:      Chemistry      Component Value Date/Time   NA 139 05/10/2023 1345   K 4.3 05/10/2023 1345   CL 96 05/10/2023 1345   CO2 30 (H) 05/10/2023 1345   BUN 16 05/10/2023 1345   CREATININE 0.79 05/10/2023 1345      Component Value Date/Time   CALCIUM 9.2 05/10/2023 1345   ALKPHOS 50 09/21/2016 1145   AST 18 09/21/2016 1145   ALT 11 09/21/2016 1145   BILITOT 0.7 09/21/2016 1145        Lab Results  Component Value Date   WBC 7.5 05/10/2023    HGB 13.5 05/10/2023   HCT 40.7 05/10/2023   MCV 93 05/10/2023   PLT 220 05/10/2023       EOS  0.5   Lab Results  Component Value Date   DDIMER 0.83 (H) 05/10/2023      Lab Results  Component Value Date   TSH 1.810 05/10/2023       BNP     05/10/2023   20      Assessment   DOE (dyspnea on exertion) 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   DDX of  difficult airways management almost all start with A and  include Adherence, Ace Inhibitors, Acid Reflux, Active Sinus Disease, Alpha 1 Antitripsin deficiency, Anxiety masquerading as Airways dz,  ABPA,  Allergy(esp in young), Aspiration (esp in elderly), Adverse effects of meds,  Active smoking or vaping, A bunch of PE's (a small clot burden can't cause this syndrome unless there is already severe underlying pulm or vascular dz with poor reserve) plus two Bs  = Bronchiectasis and Beta blocker use..and one C= CHF  Adherence is always the initial "prime suspect" and is a multilayered concern that requires a "trust but verify" approach in every patient - starting with knowing how to use medications, especially inhalers, correctly, keeping up with refills and understanding the fundamental difference between maintenance and prns vs those medications only taken for a very short course and then stopped and not refilled.  - not able to use trelegy during acute flare > change to neb  - f/u 2 weeks with all meds in hand using a trust but verify approach to confirm accurate Medication  Reconciliation The principal here is that until we are certain that the   patients are doing what we've asked, it makes no sense to ask them to do more.   ? Acid (or non-acid) GERD > always difficult to exclude as up to 75% of pts in some series report no assoc GI/ Heartburn symptoms> rec max (24h)  acid suppression and diet restrictions/ reviewed and instructions given in writing.   ? Active sinusitis > levaquin 500 mg daily x 10 d   ? Adverse drug effects > try off dpi   ? Allergy > note Eos 0.5/ may be candidate for dupixent > for now rec Prednisone 10 mg take  4 each am x 2 days,   2 each am x 2 days,  1 each am x 2 days and stop   ? Alpha one AT phenotype  MM/  level 149 rules out  D dimer nl - while a   high normal value (seen commonly in the elderly or chronically ill)  may miss small peripheral pe, the clot burden with sob is moderately high and the d dimer  has a very high neg pred value if used in this setting.    ? Chf > with BNP < 50 during symptoms and cxr rule strongly against     Chronic respiratory failure with hypoxia (HCC) HC03   05/10/2023  = 30   Pt borderline hypercarbic but appears well compensated at present   Rec: Make sure you check your oxygen saturation  AT  your highest level of activity (not after you stop)  to be sure it stays over 90% and adjust  02 flow upward to maintain this level if needed but remember to turn it back to previous settings when you stop (to conserve your supply).   F/u in 2 weeks with all meds, call sooner if needed          Each maintenance medication was reviewed in detail including emphasizing most importantly the difference between maintenance and prns and under what circumstances the prns are to be triggered using an action plan format where appropriate.  Total time for H and P, chart review, counseling, reviewing hfa/ neb/02/ pulse ox  device(s) and generating customized AVS unique to this office visit / same day charting = 47 min with pt new to me          Sandrea Hughs, MD 05/10/2023

## 2023-05-10 NOTE — Patient Instructions (Addendum)
 Duoneb is 4 x daily and in am and pm add budesonide 0.25 mg (twice daily) to the duoneb   Omnicef 300 mg twice daily   Prednisone 10 mg take  4 each am x 2 days,   2 each am x 2 days,  1 each am x 2 days and stop    Stop trelegy / chocolate / mint/menthol   - ok to use LUDEN's   For cough/ congestion >  mucinex dm  up to maximum of  1200 mg every 12 hours and use the flutter valve as much as you can    Pantoprazole (protonix) 40 mg   Take  30-60 min before first meal of the day and Pepcid (famotidine)  20 mg after supper until return to office - this is the best way to tell whether stomach acid is contributing to your problem.  Make sure you check your oxygen saturation  AT  your highest level of activity (not after you stop)   to be sure it stays over 90% and adjust  02 flow upward to maintain this level if needed but remember to turn it back to previous settings when you stop (to conserve your supply).     Please remember to go to the lab department   for your tests - we will call you with the results when they are available.    Please schedule a follow up office visit in  2 weeks, sooner if needed  with all medications /inhalers/ solutions in hand so we can verify exactly what you are taking. This includes all medications from all doctors and over the counters

## 2023-05-11 LAB — TROPONIN T: Troponin T (Highly Sensitive): 23 ng/L (ref 0–22)

## 2023-05-15 LAB — CBC WITH DIFFERENTIAL/PLATELET
Basophils Absolute: 0.1 10*3/uL (ref 0.0–0.2)
Basos: 1 %
EOS (ABSOLUTE): 0.5 10*3/uL — ABNORMAL HIGH (ref 0.0–0.4)
Eos: 6 %
Hematocrit: 40.7 % (ref 37.5–51.0)
Hemoglobin: 13.5 g/dL (ref 13.0–17.7)
Immature Grans (Abs): 0 10*3/uL (ref 0.0–0.1)
Immature Granulocytes: 0 %
Lymphocytes Absolute: 2.1 10*3/uL (ref 0.7–3.1)
Lymphs: 28 %
MCH: 30.7 pg (ref 26.6–33.0)
MCHC: 33.2 g/dL (ref 31.5–35.7)
MCV: 93 fL (ref 79–97)
Monocytes Absolute: 0.5 10*3/uL (ref 0.1–0.9)
Monocytes: 7 %
Neutrophils Absolute: 4.3 10*3/uL (ref 1.4–7.0)
Neutrophils: 58 %
Platelets: 220 10*3/uL (ref 150–450)
RBC: 4.4 x10E6/uL (ref 4.14–5.80)
RDW: 12.6 % (ref 11.6–15.4)
WBC: 7.5 10*3/uL (ref 3.4–10.8)

## 2023-05-15 LAB — ALPHA-1-ANTITRYPSIN PHENOTYP: A-1 Antitrypsin: 149 mg/dL (ref 101–187)

## 2023-05-15 LAB — TSH: TSH: 1.81 u[IU]/mL (ref 0.450–4.500)

## 2023-05-15 LAB — BASIC METABOLIC PANEL
BUN/Creatinine Ratio: 20 (ref 9–20)
BUN: 16 mg/dL (ref 6–24)
CO2: 30 mmol/L — ABNORMAL HIGH (ref 20–29)
Calcium: 9.2 mg/dL (ref 8.7–10.2)
Chloride: 96 mmol/L (ref 96–106)
Creatinine, Ser: 0.79 mg/dL (ref 0.76–1.27)
Glucose: 239 mg/dL — ABNORMAL HIGH (ref 70–99)
Potassium: 4.3 mmol/L (ref 3.5–5.2)
Sodium: 139 mmol/L (ref 134–144)
eGFR: 103 mL/min/{1.73_m2} (ref >59)

## 2023-05-15 LAB — BRAIN NATRIURETIC PEPTIDE: BNP: 20.1 pg/mL (ref 0.0–100.0)

## 2023-05-15 LAB — IGE: IgE (Immunoglobulin E), Serum: 52 [IU]/mL (ref 6–495)

## 2023-05-15 LAB — D-DIMER, QUANTITATIVE: D-DIMER: 0.83 mg{FEU}/L — ABNORMAL HIGH (ref 0.00–0.49)

## 2023-05-16 NOTE — Assessment & Plan Note (Signed)
 HC03   05/10/2023  = 30   Pt borderline hypercarbic but appears well compensated at present   Rec: Make sure you check your oxygen saturation  AT  your highest level of activity (not after you stop)   to be sure it stays over 90% and adjust  02 flow upward to maintain this level if needed but remember to turn it back to previous settings when you stop (to conserve your supply).   F/u in 2 weeks with all meds, call sooner if needed          Each maintenance medication was reviewed in detail including emphasizing most importantly the difference between maintenance and prns and under what circumstances the prns are to be triggered using an action plan format where appropriate.  Total time for H and P, chart review, counseling, reviewing hfa/ neb/02/ pulse ox  device(s) and generating customized AVS unique to this office visit / same day charting = 47 min with pt new to me

## 2023-05-19 DIAGNOSIS — J9611 Chronic respiratory failure with hypoxia: Secondary | ICD-10-CM | POA: Diagnosis not present

## 2023-05-23 NOTE — Progress Notes (Unsigned)
 Jeff Axon., male    DOB: [birth year]   MRN: [id number]   Brief patient profile:  57   yowm quit smoking 2023  self referred to pulmonary clinic 05/10/2023 by  for aecopd       PFTs May 13, 2021 showed FEV1 at 26%, ratio 39, FVC 52%, no significant bronchodilator response, DLCO 60%    History of Present Illness  05/10/2023  Pulmonary/ 1st office eval/Demarques Pilz maint on Trelgy  Chief Complaint  Patient presents with   Acute Visit    COPD   Dyspnea:  worse x  3-4 weeks  Cough: green with sensation of choking > no better p doxy/ assoc nasal congesition  Sleep: flat bed / wedge  SABA use: duoneb up to twice daily  02 use:2.5 lpm  Rec     05/24/2023  f/u ov/[place] office/Diamante Rubin re: *** maint on ***  No chief complaint on file.   Dyspnea:  *** Cough: *** Sleeping: ***   resp cc  SABA use: *** 02: ***  Lung cancer screening: ***   No obvious day to day or daytime variability or assoc excess/ purulent sputum or mucus plugs or hemoptysis or cp or chest tightness, subjective wheeze or overt sinus or hb symptoms.    Also denies any obvious fluctuation of symptoms with weather or environmental changes or other aggravating or alleviating factors except as outlined above   No unusual exposure hx or h/o childhood pna/ asthma or knowledge of premature birth.  Current Allergies, Complete Past Medical History, Past Surgical History, Family History, and Social History were reviewed in [employer] record.  ROS  The following are not active complaints unless bolded Hoarseness, sore throat, dysphagia, dental problems, itching, sneezing,  nasal congestion or discharge of excess mucus or purulent secretions, ear ache,   fever, chills, sweats, unintended wt loss or wt gain, classically pleuritic or exertional cp,  orthopnea pnd or arm/hand swelling  or leg swelling, presyncope, palpitations, abdominal pain, anorexia, nausea, vomiting, diarrhea  or change in bowel  habits or change in bladder habits, change in stools or change in urine, dysuria, hematuria,  rash, arthralgias, visual complaints, headache, numbness, weakness or ataxia or problems with walking or coordination,  change in mood or  memory.        No outpatient medications have been marked as taking for the 05/24/23 encounter (Appointment) with Nyoka Cowden, MD.            Past Medical History:  Diagnosis Date   Asthma    COPD (chronic obstructive pulmonary disease) (HCC)    Nocturnal hypoxemia       Objective:    Wt Readings from Last 3 Encounters:  05/10/23 209 lb 9.6 oz (95.1 kg)  05/01/23 209 lb (94.8 kg)  01/19/23 209 lb 9.6 oz (95.1 kg)      Vital signs reviewed  05/24/2023  - Note at rest 02 sats  ***% on ***   General appearance:    ***    Vital signs reviewed  05/24/2023  - Note at rest 02 sats  ***% on ***   General appearance:    ***    Mild barr***       Lab Results  Component Value Date   WBC 7.5 05/10/2023   HGB 13.5 05/10/2023   HCT 40.7 05/10/2023   MCV 93 05/10/2023   PLT 220 05/10/2023       EOS  0.5     Assessment

## 2023-05-24 ENCOUNTER — Ambulatory Visit: Payer: 59 | Admitting: Internal Medicine

## 2023-05-24 ENCOUNTER — Encounter: Payer: Self-pay | Admitting: Internal Medicine

## 2023-05-24 VITALS — BP 153/72 | HR 71 | Ht 67.0 in | Wt 205.0 lb

## 2023-05-24 DIAGNOSIS — J9611 Chronic respiratory failure with hypoxia: Secondary | ICD-10-CM | POA: Diagnosis not present

## 2023-05-24 DIAGNOSIS — J449 Chronic obstructive pulmonary disease, unspecified: Secondary | ICD-10-CM

## 2023-05-24 MED ORDER — TRELEGY ELLIPTA 100-62.5-25 MCG/ACT IN AEPB: INHALATION_SPRAY | RESPIRATORY_TRACT | 11 refills | Status: DC

## 2023-05-24 MED ORDER — DUPILUMAB 300 MG/2ML ~~LOC~~ SOSY
600.0000 mg | PREFILLED_SYRINGE | Freq: Once | SUBCUTANEOUS | Status: AC
  Administered 2023-05-24: 600 mg via SUBCUTANEOUS

## 2023-05-24 MED ORDER — TRELEGY ELLIPTA 100-62.5-25 MCG/ACT IN AEPB: INHALATION_SPRAY | RESPIRATORY_TRACT | Status: DC

## 2023-05-24 MED ORDER — ALBUTEROL SULFATE (2.5 MG/3ML) 0.083% IN NEBU: 2.5000 mg | INHALATION_SOLUTION | RESPIRATORY_TRACT | 12 refills | Status: DC | PRN

## 2023-05-24 MED ORDER — PREDNISONE 10 MG PO TABS: ORAL_TABLET | ORAL | 0 refills | Status: AC

## 2023-05-24 NOTE — Assessment & Plan Note (Addendum)
 Marland Kitchen

## 2023-05-24 NOTE — Addendum Note (Signed)
 Addended by: Winn Jock on: 05/24/2023 04:52 PM   Modules accepted: Orders

## 2023-05-24 NOTE — Assessment & Plan Note (Addendum)
 HC03   05/10/2023  = 30   05/24/2023   Walked on 3lpm cont    x  3  lap(s) =  approx 450  ft  @ mod pace, stopped due to end of study  with lowest 02 sats 92   Adequate control on present rx, reviewed in detail with pt > no change in rx needed    Advised: Make sure you check your oxygen saturation  AT  your highest level of activity (not after you stop)   to be sure it stays over 90% and adjust  02 flow upward to maintain this level if needed but remember to turn it back to previous settings when you stop (to conserve your supply).   F/u 6 weeks, sooner if needed   Each maintenance medication was reviewed in detail including emphasizing most importantly the difference between maintenance and prns and under what circumstances the prns are to be triggered using an action plan format where appropriate.  Total time for H and P, chart review, counseling, reviewing hfa/dpi/ neb/ 02/ injector/pulse ox /flutter  device(s) , directly observing portions of ambulatory 02 saturation study/ and generating customized AVS unique to this office visit / same day charting = 45 min

## 2023-05-24 NOTE — Assessment & Plan Note (Addendum)
 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

## 2023-05-24 NOTE — Patient Instructions (Addendum)
 For cough/ congestion >   mucinex dm  up to maximum of  1200 mg every 12 hours and use the flutter valve as much as you can    Will try to get you started on dupixent 300 mg every 2 weeks   Prednisone 10 mg  x 2 1st thing in am until better then 1 daily x 5 days and stop   Please schedule a follow up office visit in 6 weeks, call sooner if needed with all medications /inhalers/ solutions in hand so we can verify exactly what you are taking. This includes all medications from all doctors and over the counters

## 2023-05-29 DIAGNOSIS — F411 Generalized anxiety disorder: Secondary | ICD-10-CM | POA: Diagnosis not present

## 2023-05-29 DIAGNOSIS — E6609 Other obesity due to excess calories: Secondary | ICD-10-CM | POA: Diagnosis not present

## 2023-05-29 DIAGNOSIS — E1165 Type 2 diabetes mellitus with hyperglycemia: Secondary | ICD-10-CM | POA: Diagnosis not present

## 2023-05-29 DIAGNOSIS — Z1331 Encounter for screening for depression: Secondary | ICD-10-CM | POA: Diagnosis not present

## 2023-05-29 DIAGNOSIS — Z9229 Personal history of other drug therapy: Secondary | ICD-10-CM | POA: Diagnosis not present

## 2023-05-29 DIAGNOSIS — J449 Chronic obstructive pulmonary disease, unspecified: Secondary | ICD-10-CM | POA: Diagnosis not present

## 2023-05-29 DIAGNOSIS — R3589 Other polyuria: Secondary | ICD-10-CM | POA: Diagnosis not present

## 2023-05-29 DIAGNOSIS — Z6832 Body mass index (BMI) 32.0-32.9, adult: Secondary | ICD-10-CM | POA: Diagnosis not present

## 2023-05-29 DIAGNOSIS — G3183 Dementia with Lewy bodies: Secondary | ICD-10-CM | POA: Diagnosis not present

## 2023-05-29 DIAGNOSIS — J441 Chronic obstructive pulmonary disease with (acute) exacerbation: Secondary | ICD-10-CM | POA: Diagnosis not present

## 2023-05-29 DIAGNOSIS — T50905A Adverse effect of unspecified drugs, medicaments and biological substances, initial encounter: Secondary | ICD-10-CM | POA: Diagnosis not present

## 2023-05-29 DIAGNOSIS — Z0001 Encounter for general adult medical examination with abnormal findings: Secondary | ICD-10-CM | POA: Diagnosis not present

## 2023-06-13 DIAGNOSIS — G3183 Dementia with Lewy bodies: Secondary | ICD-10-CM | POA: Diagnosis not present

## 2023-06-13 DIAGNOSIS — F02A3 Dementia in other diseases classified elsewhere, mild, with mood disturbance: Secondary | ICD-10-CM | POA: Diagnosis not present

## 2023-06-13 DIAGNOSIS — F419 Anxiety disorder, unspecified: Secondary | ICD-10-CM | POA: Diagnosis not present

## 2023-06-19 ENCOUNTER — Telehealth: Payer: Self-pay

## 2023-06-19 DIAGNOSIS — J449 Chronic obstructive pulmonary disease, unspecified: Secondary | ICD-10-CM

## 2023-06-19 DIAGNOSIS — J9611 Chronic respiratory failure with hypoxia: Secondary | ICD-10-CM | POA: Diagnosis not present

## 2023-06-19 NOTE — Telephone Encounter (Signed)
 Received notification from [facility] with [employer]ent [employer]) that an enrollment application had been received. Pt appears to have commercial insurance and will not be eligible for PAP, however already having the pt in the system should make the enrollment into the copay card program quicker.  Submitted a Prior Authorization request to [facility] for DUPIXENT via CoverMyMeds. Will update once we receive a response.  Key: [id number]

## 2023-06-22 ENCOUNTER — Other Ambulatory Visit (HOSPITAL_COMMUNITY): Payer: Self-pay

## 2023-06-22 NOTE — Telephone Encounter (Signed)
 Received notification from [facility] regarding a prior authorization for DUPIXENT. Authorization has been APPROVED from 06/19/2023 to 06/18/2024. Approval letter sent to scan center.  Patient must fill through [facility]: [phone]  Authorization # [id number]   Since [employer] already has patient's application (as well as a prescription) we will f/u with [facility] Cower [facility]) to coordinate routing Rx to appropriate pharmacy along with copay card information.

## 2023-06-27 MED ORDER — DUPIXENT 300 MG/2ML ~~LOC~~ SOAJ: 300.0000 mg | SUBCUTANEOUS | 5 refills | Status: AC

## 2023-06-27 NOTE — Telephone Encounter (Signed)
 ATC patient regarding possible Dupixent new start pending confirmation if patient received first dose in clinic or not. Left VM requesting return call  Based on [facility], patient received dose in clinic in right and left thigh. Rx sent to [facility]    Geraldene Kleine, PharmD, MPH, BCPS, CPP Clinical Pharmacist (Rheumatology and Pulmonology)

## 2023-07-04 NOTE — Progress Notes (Unsigned)
 Jeff D Carneiro Jr., male    DOB: [birth year]   MRN: [id number]   Brief patient profile:  57   yowm quit smoking 2023/MM  self referred to pulmonary clinic 05/10/2023 by  for aecopd       PFTs May 13, 2021 showed FEV1 at 26%, ratio 39, FVC 52%, no significant bronchodilator response, DLCO 60%    History of Present Illness  05/10/2023  Pulmonary/ 1st office eval/Jeff Cruz maint on Trelgy  Chief Complaint  Patient presents with   Acute Visit    COPD   Dyspnea:  worse x  3-4 weeks  Cough: green with sensation of choking > no better p doxy/ assoc nasal congesition  Sleep: flat bed / wedge  SABA use: duoneb up to twice daily  02 use: 2.5 lpm  Rec Duoneb is 4 x daily and in am and pm add budesonide  0.25 mg (twice daily) to the duoneb  Omnicef  300 mg twice daily  Prednisone  10 mg take  4 each am x 2 days,   2 each am x 2 days,  1 each am x 2 days and stop   Stop trelegy / chocolate / mint/menthol   - ok to use LUDEN's  For cough/ congestion >  mucinex dm  up to maximum of  1200 mg every 12 hours and use the flutter valve as much as you can   Pantoprazole  (protonix ) 40 mg   Take  30-60 min before first meal of the day and Pepcid  (famotidine )  20 mg after supper until return to office  Make sure you check your oxygen  saturation  AT  your highest level of activity (not after you stop)   to be sure it stays over 90% Please schedule a follow up office visit in  2 weeks, sooner if needed  with all medications /inhalers/ solutions in hand    Allergy  screen 05/10/2023 >  Eos 0.5 /  IgE   52  - 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   05/24/2023  f/u ov/Jeff Cruz office/Lakynn Halvorsen re: copd/AB maint on duoneb/budesonide  0.25 > no better vs trelegy 100  Chief Complaint  Patient presents with   Follow-up    2 week follow up   Dyspnea:  no better  Cough: minimal improvement, mucus no longer green, no longer choking sensation  Sleeping: flat bed/ wedge pillow s    resp cc  SABA use: 2x daily  02:  2.5-3lpm Rec For cough/ congestion >   mucinex dm  up to maximum of  1200 mg every 12 hours and use the flutter valve as much as you can   Will try to get you started on dupixent  300 mg every 2 weeks  Prednisone  10 mg  x 2 1st thing in am until better then 1 daily x 5 days and stop  Please schedule a follow up office visit in 6 weeks, call sooner if needed with all medications /inhalers/ solutions in hand   07/05/2023  f/u ov/Jeff Cruz office/Jeff Cruz re: GOLD 4 copd/ 02 dep maint on trelegy did not  bring meds / just finished course of prednisone   Chief Complaint  Patient presents with   Cough  Dyspnea:  walking 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    Also denies any obvious fluctuation of symptoms with weather or environmental changes or other aggravating or alleviating factors except as outlined above   No unusual exposure hx or h/o childhood pna/ asthma or knowledge of premature birth.  Current Allergies, Complete Past Medical History, Past Surgical History, Family History, and Social History were reviewed in [employer] record.  ROS  The following are not active complaints unless bolded Hoarseness, sore throat, dysphagia, dental problems, itching, sneezing,  nasal congestion or discharge of excess mucus or purulent secretions, ear ache,   fever, chills, sweats, unintended wt loss or wt gain, classically pleuritic or exertional cp,  orthopnea pnd or arm/hand swelling  or leg swelling, presyncope, palpitations, abdominal pain, anorexia, nausea, vomiting, diarrhea  or change in bowel habits or change in bladder habits, change in stools or  change in urine, dysuria, hematuria,  rash, arthralgias, visual complaints, headache, numbness, weakness or ataxia or problems with walking or coordination,  change in mood or  memory.        Current Meds  Medication Sig   albuterol  (PROVENTIL ) (2.5 MG/3ML) 0.083% nebulizer solution Take 3 mLs (2.5 mg total) by nebulization every 4 (four) hours as needed.   albuterol  (VENTOLIN  HFA) 108 (90 Base) MCG/ACT inhaler Inhale 2 puffs into the lungs every 4 (four) hours as needed.   ALPRAZolam (XANAX) 0.5 MG tablet Take 0.5 mg by mouth 3 (three) times daily.   atorvastatin (LIPITOR) 20 MG tablet Take 20 mg by mouth daily.   azelastine  (ASTELIN ) 0.1 % nasal spray Place 2 sprays into both nostrils 2 (two) times daily. Use in each nostril as directed   benzonatate  (TESSALON ) 200 MG capsule Take 1 capsule (200 mg total) by mouth 2 (two) times daily as needed for cough.   budesonide  (PULMICORT ) 0.25 MG/2ML nebulizer solution SMARTSIG:1 Vial(s) Twice Daily   CINNAMON PO Take 1 tablet by mouth daily.   donepezil (ARICEPT) 10 MG tablet Take 10 mg by mouth daily.   Dupilumab  (DUPIXENT ) 300 MG/2ML SOAJ Inject 300 mg into the skin every 14 (fourteen) days.   ELDERBERRY PO Take by mouth. SYRUP PURCHASED LOCALLY   famotidine  (PEPCID ) 20 MG tablet One after supper   Fluticasone -Umeclidin-Vilant (TRELEGY ELLIPTA ) 100-62.5-25 MCG/ACT AEPB One click each am   Ginger, Zingiber officinalis, (GINGER PO) Take 1 tablet by mouth daily.   glimepiride (AMARYL) 2 MG tablet Take 2 mg by mouth daily.   guaiFENesin (MUCINEX) 600 MG 12 hr tablet Take 600 mg by mouth 2 (two) times daily.   loratadine (CLARITIN) 10 MG tablet Take 10 mg by mouth daily.   MAGNESIUM PO Take 1 tablet by mouth daily.   Multiple Vitamins-Minerals (ZINC PO) Take 1 tablet by mouth daily.   pantoprazole  (PROTONIX ) 40 MG tablet Take 1 tablet (40 mg total) by mouth daily. Take 30-60 min before first meal of the day   predniSONE  (DELTASONE ) 10 MG tablet 2 am  until better then 1 q am x 5 days and stop   QUEtiapine (SEROQUEL) 25 MG tablet Take 25 mg by mouth 2 (two) times daily.   TURMERIC PO Take 1 tablet by mouth daily.          Past Medical History:  Diagnosis Date   Asthma    COPD (chronic obstructive pulmonary disease) (HCC)    Nocturnal hypoxemia  Objective:    Wts  07/05/2023       194   05/24/23 205 lb (93 kg)  05/10/23 209 lb 9.6 oz (95.1 kg)  05/01/23 209 lb (94.8 kg)    Alert pleasant amb wm nad   Vital signs reviewed  07/05/2023  - Note at rest 02 sats  93% on 3lpm POC   HEENT :  Oropharynx  clear   Nasal turbinates nl   NECK :  without JVD/Nodes/TM/ nl carotid upstrokes bilaterally   LUNGS: no acc muscle use,  Mod barrel  contour chest wall with bilateral  Distant bs s audible wheeze and  without cough on insp or exp maneuvers and mod  Hyperresonant  to  percussion bilaterally     CV:  RRR  no s3 or murmur or increase in P2, and no edema   ABD:  obese soft and nontender  MS:   Ext warm without deformities or   obvious joint restrictions , calf tenderness, cyanosis or clubbing  SKIN: warm and dry without lesions    NEURO:  alert, approp, nl sensorium with  no motor or cerebellar deficits apparent.                              Lab Results  Component Value Date   WBC 7.5 05/10/2023   HGB 13.5 05/10/2023   HCT 40.7 05/10/2023   MCV 93 05/10/2023   PLT 220 05/10/2023       EOS                                                              0.5     . Assessment

## 2023-07-05 ENCOUNTER — Encounter: Payer: Self-pay | Admitting: Internal Medicine

## 2023-07-05 ENCOUNTER — Ambulatory Visit: Admitting: Internal Medicine

## 2023-07-05 VITALS — BP 134/72 | HR 65 | Ht 67.0 in | Wt 194.4 lb

## 2023-07-05 DIAGNOSIS — J449 Chronic obstructive pulmonary disease, unspecified: Secondary | ICD-10-CM | POA: Diagnosis not present

## 2023-07-05 DIAGNOSIS — J9611 Chronic respiratory failure with hypoxia: Secondary | ICD-10-CM | POA: Diagnosis not present

## 2023-07-05 MED ORDER — DUPILUMAB 300 MG/2ML ~~LOC~~ SOSY
300.0000 mg | PREFILLED_SYRINGE | Freq: Once | SUBCUTANEOUS | Status: AC
Start: 2023-07-05 — End: 2023-07-05
  Administered 2023-07-05: 300 mg via SUBCUTANEOUS

## 2023-07-05 NOTE — Patient Instructions (Addendum)
 Duoneb is 4 x daily and in am and pm add budesonide  0.25 mg (twice daily) to the duoneb   Stop trelegy / chocolate / mint/menthol   - ok to use LUDEN's   For cough/ congestion >  mucinex dm  up to maximum of  1200 mg every 12 hours and use the flutter valve as much as you can    Pantoprazole  (protonix ) 40 mg   Take  30-60 min before first meal of the day and Pepcid  (famotidine )  20 mg after supper until return to office - this is the best way to tell whether stomach acid is contributing to your problem.  Make sure you check your oxygen  saturation  AT  your highest level of activity (not after you stop)   to be sure it stays over 90% and adjust  02 flow upward to maintain this level if needed but remember to turn it back to previous settings when you stop (to conserve your supply).       We will try to get you certified for  Dupixent  300 mg daily - starting today.   Please schedule a follow up office visit in 6 weeks, call sooner if needed with all medications /inhalers/ solutions in hand so we can verify exactly what you are taking. This includes all medications from all doctors and over the counters

## 2023-07-05 NOTE — Assessment & Plan Note (Signed)
 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 -  05/24/2023  changed back to trelegy 100 / dupixent  300 mg q 2 weeks started 05/24/22 x 600 mg and given 300mg  07/05/2023 and referred to pharmacy     Group D (now reclassified as E) in terms of symptom/risk and laba/lama/ICS  therefore appropriate rx at this point >>>  trelegy ideal but with strictly daytime coughing may be due to upper airway irritation so try go back to duoneb plus budesonide  for now and start dupixent  with pred as backup     Advised pt/wife:  formulary restrictions will be an ongoing challenge for the forseable future and I would be happy to pick an alternative if the pt will first  provide me a list of them -  pt  will need to return here for training for any new device that is required eg dpi vs hfa vs respimat.    In the meantime we can always provide samples so that the patient never runs out of any needed respiratory medications.

## 2023-07-05 NOTE — Assessment & Plan Note (Signed)
 HC03   05/10/2023  = 30   05/24/2023   Walked on 3lpm cont    x  3  lap(s) =  approx 450  ft  @ mod pace, stopped due to end of study  with lowest 02 sats 92   Advised: Make sure you check your oxygen  saturation  AT  your highest level of activity (not after you stop)   to be sure it stays over 90% and adjust  02 flow upward to maintain this level if needed but remember to turn it back to previous settings when you stop (to conserve your supply).    F/u 6 w with all meds in hand using a trust but verify approach to confirm accurate Medication  Reconciliation The principal here is that until we are certain that the  patients are doing what we've asked, it makes no sense to ask them to do more.          Each maintenance medication was reviewed in detail including emphasizing most importantly the difference between maintenance and prns and under what circumstances the prns are to be triggered using an action plan format where appropriate.  Total time for H and P, chart review, counseling, reviewing neb/ 02/ dupixent   device(s) and generating customized AVS unique to this office visit / same day charting = 30 min

## 2023-07-05 NOTE — Addendum Note (Signed)
 Addended by: Anner Bars on: 07/05/2023 09:05 AM   Modules accepted: Orders

## 2023-07-10 NOTE — Telephone Encounter (Signed)
 Per OV notes dated 07/05/23:   It appears that pt has not yet been trained on self-injection as the medication was administered once again at his most recent appointment. It also appears that Dr. Waymond Hailey either did not read our encounter notes and/or did not discuss them with the pt as the only additional mention of pt continuing Dupixent  treatment is as follows:  We have also received a fax from [facility] stating that they have also been unable to contact the pt despite multiple attempts and will be closing the case until pt initiates communication.  Attempted to contact preferred number, the phone would be answered however I was unable to hear anyone on the other end. After the third attempt I was finally able to briefly speak with pt's wife (it sounded as though I was on a car's speaker phone) before the call dropped. Called back and went directly to VM, I provided her with my callback number and requested that she give me a return call.  Prior to disconnecting, she confirmed that pt had received doses in the office and when asked about self-injection training she stated that "yes, that's what we've been trying to do". She also mentioned that a rep from [employer] had made contact with them, however the pt's wife was told that they "could not speak with her". I advised that she was likely not added as an alternate contact at the time that the form was completed.   Will possibly need to have her complete another [employer] form and add her has the alternate contact so that she can handle the phone calls on behalf of her husband. She will also likely need to be provided with the phone number to [facility] so that they can re-open his case and receive refills.

## 2023-07-19 DIAGNOSIS — J9611 Chronic respiratory failure with hypoxia: Secondary | ICD-10-CM | POA: Diagnosis not present

## 2023-07-19 NOTE — Telephone Encounter (Signed)
 Attempted to call patient's wife/caregiver regarding Dupixent  appointment scheduling. Unable to reach her. Left voicemail.   Tolu Kyrel Leighton, PharmD [facility] PGY-1

## 2023-07-24 NOTE — Telephone Encounter (Signed)
 ATC #2 to schedule Dupixent  training. Unable to reach. Left VM with callback number  Geraldene Kleine, PharmD, MPH, BCPS, CPP Clinical Pharmacist (Rheumatology and Pulmonology)

## 2023-07-25 NOTE — Telephone Encounter (Signed)
Renee notified

## 2023-07-25 NOTE — Telephone Encounter (Signed)
 Patient needs to call [facility] to set up shipment: [phone] . We've already sent prescription to pharmacy  He needs to enroll into copay card for Dupixent  as well.  Geraldene Kleine, PharmD, MPH, BCPS, CPP Clinical Pharmacist (Rheumatology and Pulmonology)

## 2023-07-31 ENCOUNTER — Other Ambulatory Visit: Payer: Self-pay | Admitting: Internal Medicine

## 2023-07-31 DIAGNOSIS — R0609 Other forms of dyspnea: Secondary | ICD-10-CM

## 2023-08-13 NOTE — Progress Notes (Deleted)
 Jeff D Porada Jr., male    DOB: Aug 30, 1965   MRN: [id number]   Brief patient profile:  57   yowm quit smoking 2023/MM  self referred to pulmonary clinic 05/10/2023 by  for aecopd       PFTs May 13, 2021 showed FEV1 at 26%, ratio 39, FVC 52%, no significant bronchodilator response, DLCO 60%    History of Present Illness  05/10/2023  Pulmonary/ 1st Cruz eval/Jeff Cruz maint on Trelgy  Chief Complaint  Patient presents with   Acute Visit    COPD   Dyspnea:  worse x  3-4 weeks  Cough: green with sensation of choking > no better p doxy/ assoc nasal congesition  Sleep: flat bed / wedge  SABA use: duoneb up to twice daily  02 use: 2.5 lpm  Rec Duoneb is 4 x daily and in am and pm add budesonide  0.25 mg (twice daily) to the duoneb  Omnicef  300 mg twice daily  Prednisone  10 mg take  4 each am x 2 days,   2 each am x 2 days,  1 each am x 2 days and stop   Stop trelegy / chocolate / mint/menthol   - ok to use LUDEN's  For cough/ congestion >  mucinex dm  up to maximum of  1200 mg every 12 hours and use the flutter valve as much as you can   Pantoprazole  (protonix ) 40 mg   Take  30-60 min before first meal of the day and Pepcid  (famotidine )  20 mg after supper until return to [facility]  Make sure you check your oxygen  saturation  AT  your highest level of activity (not after you stop)   to be sure it stays over 90% Please schedule a follow up Cruz visit in  2 weeks, sooner if needed  with all medications /inhalers/ solutions in hand    Allergy  screen 05/10/2023 >  Eos 0.5 /  IgE   52  - 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   05/24/2023  f/u ov/[place] Cruz/Jeff Cruz re: copd/AB maint on duoneb/budesonide  0.25 > no better vs trelegy 100  Chief Complaint  Patient presents with   Follow-up    2 week follow up   Dyspnea:  no better  Cough: minimal improvement, mucus no longer green, no longer choking sensation  Sleeping: flat bed/ wedge pillow s    resp cc  SABA use: 2x daily  02:  2.5-3lpm Rec For cough/ congestion >   mucinex dm  up to maximum of  1200 mg every 12 hours and use the flutter valve as much as you can   Will try to get you started on dupixent  300 mg every 2 weeks  Prednisone  10 mg  x 2 1st thing in am until better then 1 daily x 5 days and stop  Please schedule a follow up Cruz visit in 6 weeks, call sooner if needed with all medications /inhalers/ solutions in hand   07/05/2023  f/u ov/Jeff Cruz/Jeff Cruz re: GOLD 4 copd/ 02 dep maint on trelegy did not  bring meds / just finished course of prednisone   Chief Complaint  Patient presents with   Cough  Dyspnea:  walking 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  Cough: worst in am / none noct  Sleeping: flat bed/ wedge pillow s    resp cc does fine supine  SABA use: duoneb 4 x daily confused  02: 3lpm 24/7  does not check levels Rec Duoneb is 4 x daily and in  am and pm add budesonide  0.25 mg (twice daily) to the duoneb  Stop trelegy / chocolate / mint/menthol   - ok to use LUDEN's  For cough/ congestion >  mucinex dm  up to maximum of  1200 mg every 12 hours and use the flutter valve as much as you can   Pantoprazole  (protonix ) 40 mg   Take  30-60 min before first meal of the day and Pepcid  (famotidine )  20 mg after supper until return to [facility]  Make sure you check your oxygen  saturation  AT  your highest level of activity (not after you stop)   to be sure it stays over 90%  We will try to get you certified for  Dupixent  300 mg daily - starting 07/05/23    Please schedule a follow up Cruz visit in 6 weeks, call sooner if needed with all medications /inhalers/ solutions in hand   08/16/2023  f/u ov/[place] Cruz/Jeff Cruz re: *** maint on *** did *** bring meds  No chief complaint on file.   Dyspnea:  *** Cough: *** Sleeping: ***   resp cc  SABA use: *** 02: ***  Lung cancer screening: ***   No obvious day to day or daytime variability or assoc excess/ purulent sputum or mucus plugs or hemoptysis or cp or chest  tightness, subjective wheeze or overt sinus or hb symptoms.    Also denies any obvious fluctuation of symptoms with weather or environmental changes or other aggravating or alleviating factors except as outlined above   No unusual exposure hx or h/o childhood pna/ asthma or knowledge of premature birth.  Current Allergies, Complete Past Medical History, Past Surgical History, Family History, and Social History were reviewed in [employer] record.  ROS  The following are not active complaints unless bolded Hoarseness, sore throat, dysphagia, dental problems, itching, sneezing,  nasal congestion or discharge of excess mucus or purulent secretions, ear ache,   fever, chills, sweats, unintended wt loss or wt gain, classically pleuritic or exertional cp,  orthopnea pnd or arm/hand swelling  or leg swelling, presyncope, palpitations, abdominal pain, anorexia, nausea, vomiting, diarrhea  or change in bowel habits or change in bladder habits, change in stools or change in urine, dysuria, hematuria,  rash, arthralgias, visual complaints, headache, numbness, weakness or ataxia or problems with walking or coordination,  change in mood or  memory.        No outpatient medications have been marked as taking for the 08/16/23 encounter (Appointment) with Jeff Formica, MD.             Past Medical History:  Diagnosis Date   Asthma    COPD (chronic obstructive pulmonary disease) (HCC)    Nocturnal hypoxemia       Objective:    Wts  08/16/2023          ***  07/05/2023       194   05/24/23 205 lb (93 kg)  05/10/23 209 lb 9.6 oz (95.1 kg)  05/01/23 209 lb (94.8 kg)    Vital signs reviewed  08/16/2023  - Note at rest 02 sats  ***% on ***   General appearance:    ***    Mod barr                                 Lab Results  Component Value Date   WBC 7.5 05/10/2023   HGB 13.5  05/10/2023   HCT 40.7 05/10/2023   MCV 93 05/10/2023   PLT 220 05/10/2023       EOS                                                               0.5     . Assessment

## 2023-08-14 ENCOUNTER — Other Ambulatory Visit: Payer: Self-pay | Admitting: Primary Care

## 2023-08-16 ENCOUNTER — Ambulatory Visit: Admitting: Internal Medicine

## 2023-08-16 ENCOUNTER — Encounter: Payer: Self-pay | Admitting: Internal Medicine

## 2023-08-19 DIAGNOSIS — J9611 Chronic respiratory failure with hypoxia: Secondary | ICD-10-CM | POA: Diagnosis not present

## 2023-08-21 ENCOUNTER — Telehealth: Payer: Self-pay

## 2023-08-21 DIAGNOSIS — J45909 Unspecified asthma, uncomplicated: Secondary | ICD-10-CM

## 2023-08-21 NOTE — Telephone Encounter (Signed)
 Copied from CRM [id number]. Topic: Clinical - Medication Question >> Aug 21, 2023 10:16 AM Jethro Morrison wrote: Reason for CRM: PT SPOUSE STATED HIS NEBULIZER IS NOT WORKING AND WOULD LIKE A NEW ONE. STATED HE WAS USING HIS MOMS OLD ONE AND NOW IT IS NOT WORKING.

## 2023-08-21 NOTE — Telephone Encounter (Signed)
 Spoke with wife Leighton Punches regarding the neb referral I sent in.

## 2023-09-18 DIAGNOSIS — J9611 Chronic respiratory failure with hypoxia: Secondary | ICD-10-CM | POA: Diagnosis not present

## 2023-10-10 NOTE — Progress Notes (Unsigned)
 Jeff D Arney Jr., male    DOB: [birth year]   MRN: [id number]   Brief patient profile:  58   yowm quit smoking 2023/MM  self referred to pulmonary clinic 05/10/2023 by  for aecopd       PFTs May 13, 2021 showed FEV1 at 26%, ratio 39, FVC 52%, no significant bronchodilator response, DLCO 60%    History of Present Illness  05/10/2023  Pulmonary/ 1st office eval/Zygmund Passero maint on Trelgy  Chief Complaint  Patient presents with   Acute Visit    COPD   Dyspnea:  worse x  3-4 weeks  Cough: green with sensation of choking > no better p doxy/ assoc nasal congesition  Sleep: flat bed / wedge  SABA use: duoneb up to twice daily  02 use: 2.5 lpm  Rec Duoneb is 4 x daily and in am and pm add budesonide  0.25 mg (twice daily) to the duoneb  Omnicef  300 mg twice daily  Prednisone  10 mg take  4 each am x 2 days,   2 each am x 2 days,  1 each am x 2 days and stop   Stop trelegy / chocolate / mint/menthol   - ok to use LUDEN's  For cough/ congestion >  mucinex dm  up to maximum of  1200 mg every 12 hours and use the flutter valve as much as you can   Pantoprazole  (protonix ) 40 mg   Take  30-60 min before first meal of the day and Pepcid  (famotidine )  20 mg after supper until return to office  Make sure you check your oxygen  saturation  AT  your highest level of activity (not after you stop)   to be sure it stays over 90% Please schedule a follow up office visit in  2 weeks, sooner if needed  with all medications /inhalers/ solutions in hand    Allergy  screen 05/10/2023 >  Eos 0.5 /  IgE   52  - 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   05/24/2023  f/u ov/[place] office/Klaryssa Fauth re: copd/AB maint on duoneb/budesonide  0.25 > no better vs trelegy 100  Chief Complaint  Patient presents with   Follow-up    2 week follow up   Dyspnea:  no better  Cough: minimal improvement, mucus no longer green, no longer choking sensation  Sleeping: flat bed/ wedge pillow s    resp cc  SABA use: 2x daily  02:  2.5-3lpm Rec For cough/ congestion >   mucinex dm  up to maximum of  1200 mg every 12 hours and use the flutter valve as much as you can   Will try to get you started on dupixent  300 mg every 2 weeks  Prednisone  10 mg  x 2 1st thing in am until better then 1 daily x 5 days and stop  Please schedule a follow up office visit in 6 weeks, call sooner if needed with all medications /inhalers/ solutions in hand   07/05/2023  f/u ov/ office/Jeff Cruz re: GOLD 4 copd/ 02 dep maint on trelegy did not  bring meds / just finished course of prednisone   Chief Complaint  Patient presents with   Cough  Dyspnea:  walking 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  Cough: worst in am / none noct  Sleeping: flat bed/ wedge pillow s    resp cc does fine supine  SABA use: duoneb 4 x daily confused  02: 3lpm 24/7  does not check levels Rec     10/11/2023  f/u  ov/[place] office/Medrith Veillon re: *** maint on ***  No chief complaint on file.   Dyspnea:  *** Cough: *** Sleeping: ***   resp cc  SABA use: *** 02: ***  Lung cancer screening: ***   No obvious day to day or daytime variability or assoc excess/ purulent sputum or mucus plugs or hemoptysis or cp or chest tightness, subjective wheeze or overt sinus or hb symptoms.    Also denies any obvious fluctuation of symptoms with weather or environmental changes or other aggravating or alleviating factors except as outlined above   No unusual exposure hx or h/o childhood pna/ asthma or knowledge of premature birth.  Current Allergies, Complete Past Medical History, Past Surgical History, Family History, and Social History were reviewed in [employer] record.  ROS  The following are not active complaints unless bolded Hoarseness, sore throat, dysphagia, dental problems, itching, sneezing,  nasal congestion or discharge of excess mucus or purulent secretions, ear ache,   fever, chills, sweats, unintended wt loss or wt gain, classically pleuritic or  exertional cp,  orthopnea pnd or arm/hand swelling  or leg swelling, presyncope, palpitations, abdominal pain, anorexia, nausea, vomiting, diarrhea  or change in bowel habits or change in bladder habits, change in stools or change in urine, dysuria, hematuria,  rash, arthralgias, visual complaints, headache, numbness, weakness or ataxia or problems with walking or coordination,  change in mood or  memory.        No outpatient medications have been marked as taking for the 10/11/23 encounter (Appointment) with Darlean Ozell NOVAK, MD.            Past Medical History:  Diagnosis Date   Asthma    COPD (chronic obstructive pulmonary disease) (HCC)    Nocturnal hypoxemia       Objective:    Wts  10/11/2023        ***  07/05/2023       194   05/24/23 205 lb (93 kg)  05/10/23 209 lb 9.6 oz (95.1 kg)  05/01/23 209 lb (94.8 kg)    Vital signs reviewed  10/11/2023  - Note at rest 02 sats  ***% on ***   General appearance:    ***   Mod barr ***                                 . Assessment

## 2023-10-11 ENCOUNTER — Ambulatory Visit: Admitting: Internal Medicine

## 2023-10-11 ENCOUNTER — Encounter: Payer: Self-pay | Admitting: Internal Medicine

## 2023-10-11 VITALS — BP 116/69 | HR 60 | Ht 67.0 in | Wt 190.8 lb

## 2023-10-11 DIAGNOSIS — J9611 Chronic respiratory failure with hypoxia: Secondary | ICD-10-CM | POA: Diagnosis not present

## 2023-10-11 DIAGNOSIS — J449 Chronic obstructive pulmonary disease, unspecified: Secondary | ICD-10-CM | POA: Diagnosis not present

## 2023-10-11 DIAGNOSIS — Z87891 Personal history of nicotine dependence: Secondary | ICD-10-CM | POA: Insufficient documentation

## 2023-10-11 MED ORDER — IPRATROPIUM-ALBUTEROL 0.5-2.5 (3) MG/3ML IN SOLN: 3.0000 mL | Freq: Four times a day (QID) | RESPIRATORY_TRACT | 11 refills | Status: AC

## 2023-10-11 NOTE — Patient Instructions (Addendum)
 Try change pepcid  to take after bfast and supper (twice daily and off the pantoprazole )  Stop trelegy   Duoneb is up to 4 x daily but the am dose and pm are combined with budesonide  0.25 mg   My office will be contacting you by phone for referral to lung cancer screening   [phone]) - if you don't hear back from my office within one week,  please call us  back or notify us  thru MyChart and we'll address it right away.    Make sure you check your oxygen  saturation  AT  your highest level of activity (not after you stop)   to be sure it stays over 90% and adjust  02 flow upward to maintain this level if needed but remember to turn it back to previous settings when you stop (to conserve your supply).   Please schedule a follow up visit in 3 months but call sooner if needed  with all medications /inhalers/ solutions in hand so we can verify exactly what you are taking. This includes all medications from all doctors and over the counters - PFTs on return

## 2023-10-11 NOTE — Assessment & Plan Note (Signed)
 HC03   05/10/2023  = 30   05/24/2023   Walked on 3lpm cont    x  3  lap(s) =  approx 450  ft  @ mod pace, stopped due to end of study  with lowest 02 sats 92  - 10/11/2023   Walked on RA  x  2  lap(s) =  approx 300  ft  @ slow/mod pace, stopped due to desats to 88%  with no sob > rec portable 02 / titrate with walking > 300 ft eg [employer] shopping.  Make sure you check your oxygen  saturation  AT  your highest level of activity (not after you stop)   to be sure it stays over 90% and adjust  02 flow upward to maintain this level if needed but remember to turn it back to previous settings when you stop (to conserve your supply).  F/u 3 m, sooner if needed with new set of pts on return   Each maintenance medication was reviewed in detail including emphasizing most importantly the difference between maintenance and prns and under what circumstances the prns are to be triggered using an action plan format where appropriate.  Total time for H and P, chart review, counseling, reviewing dpi/elipta/ 02/ pulse ox  device(s) , directly observing portions of ambulatory 02 saturation study/ and generating customized AVS unique to this office visit / same day charting = 42 min with new dx of daytime 02 need and repeated issues with poor insight into meds /instructions even with wife's active help.

## 2023-10-11 NOTE — Assessment & Plan Note (Signed)
 Referred for LCS  10/11/2023 >>>   Low-dose CT lung cancer screening is recommended for patients who are 37-59 years of age with a 20+ pack-year history of smoking and who are currently smoking or quit <=15 years ago. No coughing up blood  No unintentional weight loss of > 15 pounds in the last 6 months - pt is eligible for scanning yearly until 2038 > referred today

## 2023-10-11 NOTE — Assessment & Plan Note (Signed)
 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

## 2023-10-12 ENCOUNTER — Telehealth: Payer: Self-pay | Admitting: Internal Medicine

## 2023-10-12 NOTE — Telephone Encounter (Signed)
 Left message regarding the Tuesday 01/02/24 3:00pm PFT appointment at [facility] time is 2:45 pm--1st floor registration desk--follow up with Dr. Darlean is 01/02/24 at 4:00pm--will mail information/instructions to patient --requested return confirmation call

## 2023-10-19 DIAGNOSIS — J9611 Chronic respiratory failure with hypoxia: Secondary | ICD-10-CM | POA: Diagnosis not present

## 2023-11-19 DIAGNOSIS — J9611 Chronic respiratory failure with hypoxia: Secondary | ICD-10-CM | POA: Diagnosis not present

## 2023-12-19 DIAGNOSIS — J9611 Chronic respiratory failure with hypoxia: Secondary | ICD-10-CM | POA: Diagnosis not present

## 2024-01-02 ENCOUNTER — Ambulatory Visit: Admitting: Internal Medicine

## 2024-01-02 ENCOUNTER — Ambulatory Visit (HOSPITAL_COMMUNITY)
Admission: RE | Admit: 2024-01-02 | Discharge: 2024-01-02 | Disposition: A | Discharge status: Home / Self Care | Source: Ambulatory Visit | Attending: Internal Medicine | Admitting: Internal Medicine

## 2024-01-02 ENCOUNTER — Encounter: Payer: Self-pay | Admitting: Internal Medicine

## 2024-01-02 VITALS — BP 150/74 | HR 64 | Ht 67.0 in | Wt 184.6 lb

## 2024-01-02 DIAGNOSIS — Z87891 Personal history of nicotine dependence: Secondary | ICD-10-CM

## 2024-01-02 DIAGNOSIS — J449 Chronic obstructive pulmonary disease, unspecified: Secondary | ICD-10-CM | POA: Diagnosis not present

## 2024-01-02 DIAGNOSIS — J9611 Chronic respiratory failure with hypoxia: Secondary | ICD-10-CM

## 2024-01-02 DIAGNOSIS — R0609 Other forms of dyspnea: Secondary | ICD-10-CM

## 2024-01-02 LAB — PULMONARY FUNCTION TEST
DL/VA % pred: 111 %
DL/VA: 4.75 ml/min/mmHg/L
DLCO unc % pred: 63 %
DLCO unc: 16.03 ml/min/mmHg
FEF 25-75 Post: 0.38 L/s
FEF 25-75 Pre: 0.18 L/s
FEF2575-%Change-Post: 113 %
FEF2575-%Pred-Post: 13 %
FEF2575-%Pred-Pre: 6 %
FEV1-%Change-Post: 21 %
FEV1-%Pred-Post: 21 %
FEV1-%Pred-Pre: 18 %
FEV1-Post: 0.72 L
FEV1-Pre: 0.59 L
FEV1FVC-%Change-Post: -6 %
FEV1FVC-%Pred-Pre: 47 %
FEV6-%Change-Post: 29 %
FEV6-%Pred-Post: 42 %
FEV6-%Pred-Pre: 32 %
FEV6-Post: 1.74 L
FEV6-Pre: 1.34 L
FEV6FVC-%Change-Post: -1 %
FEV6FVC-%Pred-Post: 85 %
FEV6FVC-%Pred-Pre: 86 %
FVC-%Change-Post: 29 %
FVC-%Pred-Post: 49 %
FVC-%Pred-Pre: 38 %
FVC-Post: 2.13 L
FVC-Pre: 1.64 L
Post FEV1/FVC ratio: 34 %
Post FEV6/FVC ratio: 81 %
Pre FEV1/FVC ratio: 36 %
Pre FEV6/FVC Ratio: 82 %
RV % pred: 161 %
RV: 3.31 L
TLC % pred: 80 %
TLC: 5.17 L

## 2024-01-02 MED ORDER — CEFDINIR 300 MG PO CAPS: 300.0000 mg | ORAL_CAPSULE | Freq: Two times a day (BID) | ORAL | 0 refills | Status: AC

## 2024-01-02 MED ORDER — METHYLPREDNISOLONE ACETATE 80 MG/ML IJ SUSP
120.0000 mg | Freq: Once | INTRAMUSCULAR | Status: AC
  Administered 2024-01-02: 120 mg via INTRAMUSCULAR

## 2024-01-02 MED ORDER — ALBUTEROL SULFATE (2.5 MG/3ML) 0.083% IN NEBU
2.5000 mg | INHALATION_SOLUTION | Freq: Once | RESPIRATORY_TRACT | Status: AC
  Administered 2024-01-02: 2.5 mg via RESPIRATORY_TRACT

## 2024-01-02 NOTE — Progress Notes (Unsigned)
 Jeff D Sianez Jr., male    DOB: [birth year]   MRN: [id number]   Brief patient profile:  58   yowm quit smoking 2023/MM  self referred to pulmonary clinic 05/10/2023 by  for aecopd       PFTs May 13, 2021 showed FEV1 at 26%, ratio 0.39, FVC 52%, no significant bronchodilator response, DLCO 60%    History of Present Illness  05/10/2023  Pulmonary/ 1st office eval/Jeff Cruz maint on Trelgy  Chief Complaint  Patient presents with   Acute Visit    COPD   Dyspnea:  worse x  3-4 weeks  Cough: green with sensation of choking > no better p doxy/ assoc nasal congesition  Sleep: flat bed / wedge  SABA use: duoneb up to twice daily  02 use: 2.5 lpm  Rec Duoneb is 4 x daily and in am and pm add budesonide  0.25 mg (twice daily) to the duoneb  Omnicef  300 mg twice daily  Prednisone  10 mg take  4 each am x 2 days,   2 each am x 2 days,  1 each am x 2 days and stop   Stop trelegy / chocolate / mint/menthol   - ok to use LUDEN's  For cough/ congestion >  mucinex dm  up to maximum of  1200 mg every 12 hours and use the flutter valve as much as you can   Pantoprazole  (protonix ) 40 mg   Take  30-60 min before first meal of the day and Pepcid  (famotidine )  20 mg after supper until return to office  Make sure you check your oxygen  saturation  AT  your highest level of activity (not after you stop)   to be sure it stays over 90% Please schedule a follow up office visit in  2 weeks, sooner if needed  with all medications /inhalers/ solutions in hand    Allergy  screen 05/10/2023 >  Eos 0.5 /  IgE   52  - 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   05/24/2023  f/u ov/[place] office/Jeff Cruz re: copd/AB maint on duoneb/budesonide  0.25 > no better vs trelegy 100  Chief Complaint  Patient presents with   Follow-up    2 week follow up   Dyspnea:  no better  Cough: minimal improvement, mucus no longer green, no longer choking sensation  Sleeping: flat bed/ wedge pillow s    resp cc  SABA use: 2x daily  02:  2.5-3lpm Rec For cough/ congestion >   mucinex dm  up to maximum of  1200 mg every 12 hours and use the flutter valve as much as you can   Will try to get you started on dupixent  300 mg every 2 weeks  Prednisone  10 mg  x 2 1st thing in am until better then 1 daily x 5 days and stop  Please schedule a follow up office visit in 6 weeks, call sooner if needed with all medications /inhalers/ solutions in hand   07/05/2023  f/u ov/[place] office/Jeff Cruz re: GOLD 4 copd/ 02 dep maint on trelegy did not  bring meds / just finished course of prednisone   Chief Complaint  Patient presents with   Cough  Dyspnea:  walking 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  Cough: worst in am / none noct  Sleeping: flat bed/ wedge pillow s    resp cc does fine supine  SABA use: duoneb 4 x daily confused  02: 3lpm 24/7  does not check levels Rec Duoneb is 4 x daily and in  am and pm add budesonide  0.25 mg (twice daily) to the duoneb  Stop trelegy / chocolate / mint/menthol   - ok to use LUDEN's  For cough/ congestion >  mucinex dm  up to maximum of  1200 mg every 12 hours and use the flutter valve as much as you can   Pantoprazole  (protonix ) 40 mg   Take  30-60 min before first meal of the day and Pepcid  (famotidine )  20 mg after supper until return to office - Make sure you check your oxygen  saturation  AT  your highest level of activity (not after you stop)   to be sure it stays over 90%.       We will try to get you certified for  Dupixent  300 mg daily - starting today.   Please schedule a follow up office visit in 6 weeks, call sooner if needed with all medications /inhalers/ solutions in hand     10/11/2023  f/u ov/[place] office/Jeff Cruz re: GOLD 4 copd/ 02 dep  maint on dupixent  /duoneb 3-4 x per day   no recent prednisone   Chief Complaint  Patient presents with   Follow-up   COPD  Dyspnea:  walking dogs / [place] problem off 02  Cough: still rattling all day / can't remember to use flutter  Sleeping:  flat bed with wedge pillow s   resp cc  SABA use: duoneb up to qid not really sure it makes any differetn 02: 3lpm hs and daytime prn Lung cancer screening: referred 10/11/2023  Rec Try change pepcid  to take after bfast and supper (twice daily and off the pantoprazole ) Stop trelegy  Duoneb is up to 4 x daily but the am dose and pm are combined with budesonide  0.25 mg  My office will be contacting you by phone for referral to lung cancer screening   [phone]) > not done as of 01/02/2024    Make sure you check your oxygen  saturation  AT  your highest level of activity (not after you stop)   to be sure it stays over 90%    Please schedule a follow up visit in 3 months but call sooner if needed  with all medications /inhalers/ solutions  - PFTs on return   PFT's  01/02/2024   FEV1 0.72 (21% % ) ratio 0.34  p 21 % improvement from saba p 0 prior to study with DLCO  16 (63%)   and FV curve severely concave     01/02/2024  f/u ov/[place] office/Jeff Cruz re: GOLD 4 copd/ 02 dep maint on duoneb / bud bid and dupixent  q 2 weeks    needs LDSCT but sick x 3-4 weeks   Chief Complaint  Patient presents with   COPD    Congestions, coughing with some green   Dyspnea:  doing ok on 3lpm  Cough: worse cough x 3-4 weeks still green p  zpak assoc with nasal congestion but no purulent nasal d/c Sleeping: bed is flat/ wedge pillow s  resp cc  SABA use:  02: 3lpm 24/7   Lung cancer screening: deferred as too ill for now    No obvious day to day or daytime variability or assoc   mucus plugs or hemoptysis or cp or chest tightness, subjective wheeze or overt  r hb symptoms.    Also denies any obvious fluctuation of symptoms with weather or environmental changes or other aggravating or alleviating factors except as outlined above   No unusual  exposure hx or h/o childhood pna/ asthma or knowledge of premature birth.  Current Allergies, Complete Past Medical History, Past Surgical History, Family History,  and Social History were reviewed in [employer] record.  ROS  The following are not active complaints unless bolded Hoarseness, sore throat, dysphagia, dental problems, itching, sneezing,  nasal congestion or discharge of excess mucus or purulent secretions, ear ache,   fever, chills, sweats, unintended wt loss or wt gain, classically pleuritic or exertional cp,  orthopnea pnd or arm/hand swelling  or leg swelling, presyncope, palpitations, abdominal pain, anorexia, nausea, vomiting, diarrhea  or change in bowel habits or change in bladder habits, change in stools or change in urine, dysuria, hematuria,  rash, arthralgias, visual complaints, headache, numbness, weakness or ataxia or problems with walking or coordination,  change in mood or  memory.        Current Meds  Medication Sig   albuterol  (VENTOLIN  HFA) 108 (90 Base) MCG/ACT inhaler Inhale 2 puffs into the lungs every 4 (four) hours as needed.   ALPRAZolam (XANAX) 0.5 MG tablet Take 0.5 mg by mouth 3 (three) times daily.   atorvastatin (LIPITOR) 20 MG tablet Take 20 mg by mouth daily.   azelastine  (ASTELIN ) 0.1 % nasal spray Place 2 sprays into both nostrils 2 (two) times daily. Use in each nostril as directed   benzonatate  (TESSALON ) 200 MG capsule Take 1 capsule (200 mg total) by mouth 2 (two) times daily as needed for cough.   budesonide  (PULMICORT ) 0.25 MG/2ML nebulizer solution SMARTSIG:1 Vial(s) Twice Daily   donepezil (ARICEPT) 10 MG tablet Take 10 mg by mouth daily.   Dupilumab  (DUPIXENT ) 300 MG/2ML SOAJ Inject 300 mg into the skin every 14 (fourteen) days.   famotidine  (PEPCID ) 20 MG tablet One after supper   glimepiride (AMARYL) 2 MG tablet Take 2 mg by mouth daily.   guaiFENesin (MUCINEX) 600 MG 12 hr tablet Take 600 mg by mouth 2 (two) times daily.   ipratropium-albuterol  (DUONEB) 0.5-2.5 (3) MG/3ML SOLN Take 3 mLs by nebulization 4 (four) times daily.   loratadine (CLARITIN) 10 MG tablet Take 10 mg by  mouth daily.   QUEtiapine (SEROQUEL) 25 MG tablet Take 25 mg by mouth 2 (two) times daily.               Past Medical History:  Diagnosis Date   Asthma    COPD (chronic obstructive pulmonary disease) (HCC)    Nocturnal hypoxemia       Objective:    Wts  01/02/2024      183  10/11/2023       190  07/05/2023       194   05/24/23 205 lb (93 kg)  05/10/23 209 lb 9.6 oz (95.1 kg)  05/01/23 209 lb (94.8 kg)    Vital signs reviewed  01/02/2024  - Note at rest 02 sats  89% on 3 lpm POC   General appearance:    chronically ill amb wm, not very interactive. Rattling congested sounding cough    HEENT : Oropharynx  clear     NECK :  without  apparent JVD/ palpable Nodes/TM    LUNGS: no acc muscle use,  Mild barrel  contour chest wall with bilateral  Distant bs s audible wheeze and  without cough on insp or exp maneuvers  and mild  Hyperresonant  to  percussion bilaterally     CV:  RRR  no s3 or murmur or increase in P2, and no  edema   ABD:  soft and nontender   MS:  Nl gait/ ext warm without deformities Or obvious joint restrictions  calf tenderness, cyanosis or clubbing     SKIN: warm and dry without lesions    NEURO:  alert, approp, nl sensorium with  no motor or cerebellar deficits apparent.                                . 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 -  05/24/2023  changed back to trelegy 100 / dupixent  300 mg q 2 weeks started 05/24/23 x 600 mg and given 300mg  07/05/2023 and referred to pharmacy  -PFTs May 13, 2021 showed FEV1 at 26%,  ratio 39, FVC 52%, no significant bronchodilator response, DLCO 60%  - 05/10/2023 eval for refractory cough on trelegy so rec change to duoneb qid with budesonide  0.25 mg bid and max rx for gerd/ pred x 6 d, omnicef  300 mg bid x 10 d  - Allergy  screen 05/10/2023 >  Eos 0.5 /  IgE   52  - 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         Chronic respiratory failure with hypoxia (HCC) HC03   05/10/2023  = 30   05/24/2023   Walked on 3lpm cont    x  3  lap(s) =  approx 450  ft  @ mod pace, stopped due to end of study  with lowest 02 sats 92  - 10/11/2023   Walked on RA  x  2  lap(s) =  approx 300  ft  @ slow/mod pace, stopped due to desats to 88%  with no sob > rec portable 02 / titrate with walking > 300 ft eg [employer] shopping.  Becoming much less active due to cognitive decline so no increase in 02 needs at this time          Each maintenance medication was reviewed in detail including emphasizing most importantly the difference between maintenance and prns and under what circumstances the prns are to be triggered using an action plan format where appropriate.  Total time for H and P, chart review, counseling, reviewing dpi, 02/ flutter valve neb  device(s) and generating customized AVS unique to  this office visit / same day charting = 30 min          AVS  Patient Instructions  Omnicef  300 mg twice daily  x 10 day    Depomedrol 120 mg IM   For cough/ congestion >  mucinex or mucinex dm  up to maximum of  1200 mg every 12 hours and use the flutter valve as much as you can     Please schedule a follow up visit in 3 months but call sooner if needed      Ozell America, MD 01/03/2024

## 2024-01-02 NOTE — Patient Instructions (Signed)
 Omnicef  300 mg twice daily  x 10 day    Depomedrol 120 mg IM   For cough/ congestion > mucinex or mucinex dm  up to maximum of  1200 mg every 12 hours and use the flutter valve as much as you can     Please schedule a follow up visit in 3 months but call sooner if needed

## 2024-01-03 NOTE — Assessment & Plan Note (Addendum)
 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 -  05/24/2023  changed back to trelegy 100 / dupixent  300 mg q 2 weeks started 05/24/23 x 600 mg and given 300mg  07/05/2023 and referred to pharmacy  -PFTs May 13, 2021 showed FEV1 at 26%, ratio 39, FVC 52%, no significant bronchodilator response, DLCO 60%  - 05/10/2023 eval for refractory cough on trelegy so rec change to duoneb qid with budesonide  0.25 mg bid and max rx for gerd/ pred x 6 d, omnicef  300 mg bid x 10 d  - Allergy  screen 05/10/2023 >  Eos 0.5 /  IgE   52  - 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

## 2024-01-03 NOTE — Assessment & Plan Note (Addendum)
 HC03   05/10/2023  = 30   05/24/2023   Walked on 3lpm cont    x  3  lap(s) =  approx 450  ft  @ mod pace, stopped due to end of study  with lowest 02 sats 92  - 10/11/2023   Walked on RA  x  2  lap(s) =  approx 300  ft  @ slow/mod pace, stopped due to desats to 88%  with no sob > rec portable 02 / titrate with walking > 300 ft eg [employer] shopping.  Becoming much less active due to cognitive decline so no increase in 02 needs at this time          Each maintenance medication was reviewed in detail including emphasizing most importantly the difference between maintenance and prns and under what circumstances the prns are to be triggered using an action plan format where appropriate.  Total time for H and P, chart review, counseling, reviewing dpi, 02/ flutter valve neb  device(s) and generating customized AVS unique to this office visit / same day charting = 30 min

## 2024-01-07 ENCOUNTER — Ambulatory Visit: Payer: Self-pay | Admitting: Internal Medicine

## 2024-01-08 DIAGNOSIS — G3183 Dementia with Lewy bodies: Secondary | ICD-10-CM | POA: Diagnosis not present

## 2024-01-08 DIAGNOSIS — F02B4 Dementia in other diseases classified elsewhere, moderate, with anxiety: Secondary | ICD-10-CM | POA: Diagnosis not present

## 2024-01-18 ENCOUNTER — Ambulatory Visit: Admitting: Internal Medicine

## 2024-01-18 ENCOUNTER — Encounter: Payer: Self-pay | Admitting: Internal Medicine

## 2024-01-18 ENCOUNTER — Ambulatory Visit: Payer: Self-pay | Admitting: Internal Medicine

## 2024-01-18 VITALS — BP 135/72 | HR 60 | Ht 67.0 in | Wt 174.4 lb

## 2024-01-18 DIAGNOSIS — R0609 Other forms of dyspnea: Secondary | ICD-10-CM | POA: Diagnosis not present

## 2024-01-18 DIAGNOSIS — Z87891 Personal history of nicotine dependence: Secondary | ICD-10-CM

## 2024-01-18 DIAGNOSIS — J9611 Chronic respiratory failure with hypoxia: Secondary | ICD-10-CM

## 2024-01-18 DIAGNOSIS — J449 Chronic obstructive pulmonary disease, unspecified: Secondary | ICD-10-CM

## 2024-01-18 DIAGNOSIS — E119 Type 2 diabetes mellitus without complications: Secondary | ICD-10-CM | POA: Diagnosis not present

## 2024-01-18 DIAGNOSIS — G4734 Idiopathic sleep related nonobstructive alveolar hypoventilation: Secondary | ICD-10-CM | POA: Diagnosis not present

## 2024-01-18 DIAGNOSIS — J45909 Unspecified asthma, uncomplicated: Secondary | ICD-10-CM | POA: Diagnosis not present

## 2024-01-18 DIAGNOSIS — G3183 Dementia with Lewy bodies: Secondary | ICD-10-CM | POA: Diagnosis not present

## 2024-01-18 DIAGNOSIS — F419 Anxiety disorder, unspecified: Secondary | ICD-10-CM | POA: Diagnosis not present

## 2024-01-18 MED ORDER — PREDNISONE 10 MG PO TABS
ORAL_TABLET | ORAL | 2 refills | Status: AC
Start: 2024-01-18 — End: ?

## 2024-01-18 NOTE — Assessment & Plan Note (Addendum)
 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 -  05/24/2023  changed back to trelegy 100 / dupixent  300 mg q 2 weeks started 05/24/23 x 600 mg and given 300mg  07/05/2023 and referred to pharmacy  -PFTs May 13, 2021 showed FEV1 at 26%, ratio 39, FVC 52%, no significant bronchodilator response, DLCO 60%  - 05/10/2023 eval for refractory cough on trelegy so rec change to duoneb qid with budesonide  0.25 mg bid and max rx for gerd/ pred x 6 d, omnicef  300 mg bid x 10 d  - Allergy  screen 05/10/2023 >  Eos 0.5 /  IgE   52  - 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 -  05/24/2023  changed back to trelegy 100 / dupixent  300 mg q 2 weeks started 05/24/23  - PFT's  01/02/2024   FEV1 0.72 (21% % ) ratio 0.34  p 21 % improvement from saba p 0 prior to study with DLCO  16 (63%)   and FV curve severely concave     Main issue at this poin this point is not really resp distress or hyypoxemia but rather difficulty with retained secretions and now appears hosp appropriate to me   Will try pred 10 x 2 daily until appetite/ congestion improve then taper off   Could do MBS and VEST but now that starting hospice program probably moot point as is continuing dupixent / advised.

## 2024-01-18 NOTE — Telephone Encounter (Signed)
 FYI Only or Action Required?: FYI only for provider: appointment scheduled on 11/6.  Patient is followed in Pulmonology for COPD, last seen on 01/02/2024 by Darlean Ozell NOVAK, MD.  Called Nurse Triage reporting Cough.  Symptoms began several weeks ago.  Interventions attempted: Nebulizer treatments and Home oxygen  use.  Symptoms are: unchanged.  Triage Disposition: See HCP Within 4 Hours (Or PCP Triage)  Patient/caregiver understands and will follow disposition?: yes  E2C2 Pulmonary Triage - Initial Assessment Questions "Chief Complaint (e.g., cough, sob, wheezing, fever, chills, sweat or additional symptoms) *Go to specific symptom protocol after initial questions. Wheezing, cough  "How long have symptoms been present?" OV 10/21  Have you tested for COVID or Flu? Note: If not, ask patient if a home test can be taken. If so, instruct patient to call back for positive results. No  MEDICINES:   "Have you used any OTC meds to help with symptoms?" Yes If yes, ask "What medications?" Mucinex  "Have you used your inhalers/maintenance medication?" Yes If yes, "What medications?" Duoneb, Pulmicort , Albuterol - some at night  If inhaler, ask "How many puffs and how often?" Note: Review instructions on medication in the chart. 2-3 times/day  OXYGEN : "Do you wear supplemental oxygen ?" Yes If yes, "How many liters are you supposed to use?" 3l  "Do you monitor your oxygen  levels?" Yes If yes, What is your reading (oxygen  level) today? Unsure- patient is in bed asleep  What is your usual oxygen  saturation reading?  (Note: Pulmonary O2 sats should be 90% or greater) 90% range    Reason for Disposition  Wheezing is present  Answer Assessment - Initial Assessment Questions 1. ONSET: When did the cough begin?      OV 10/21- given steroid injection and Cefdinir  300 mg Oral 2 times daily- reporting no better 2. SEVERITY: How bad is the cough today?      Rattle in chest mild  wheezing 3. SPUTUM: Describe the color of your sputum (e.g., none, dry cough; clear, white, yellow, green)     Green in color- increased 4. HEMOPTYSIS: Are you coughing up any blood? If Yes, ask: How much? (e.g., flecks, streaks, tablespoons, etc.)     no 5. DIFFICULTY BREATHING: Are you having difficulty breathing? If Yes, ask: How bad is it? (e.g., mild, moderate, severe)      no 6. FEVER: Do you have a fever? If Yes, ask: What is your temperature, how was it measured, and when did it start?     no 7. CARDIAC HISTORY: Do you have any history of heart disease? (e.g., heart attack, congestive heart failure)      no 8. LUNG HISTORY: Do you have any history of lung disease?  (e.g., pulmonary embolus, asthma, emphysema)     Asthma, COPD  10. OTHER SYMPTOMS: Do you have any other symptoms? (e.g., runny nose, wheezing, chest pain)       wheezing  Protocols used: Cough - Acute Productive-A-AH Copied from CRM #[id number]. Topic: Clinical - Red Word Triage >> Jan 18, 2024  8:44 AM Russell PARAS wrote: Red Word that prompted transfer to Nurse Triage:   Chest congestion and rattling in chest Coughing up phlegm, is green in color Mild wheezing No SOB  Saw provider on 10/21 for symptoms, prescribed antibiotic, but symptoms not improving   Pt of [place]

## 2024-01-18 NOTE — Patient Instructions (Addendum)
 Prednisone  10 mg  x 2 each  AM  until improves then 1 daily  x 5 days and stop   No change in the other medications   Follow up now that he is on Hospice is as needed

## 2024-01-18 NOTE — Assessment & Plan Note (Addendum)
 HC03   05/10/2023  = 30   05/24/2023   Walked on 3lpm cont    x  3  lap(s) =  approx 450  ft  @ mod pace, stopped due to end of study  with lowest 02 sats 92  - 10/11/2023   Walked on RA  x  2  lap(s) =  approx 300  ft  @ slow/mod pace, stopped due to desats to 88%  with no sob > rec portable 02 / titrate with walking > 300 ft eg [employer] shopping.  Well compensated at present on 3lpm > ok to titrate up if necessary.   Each maintenance medication was reviewed in detail including emphasizing most importantly the difference between maintenance and prns and under what circumstances the prns are to be triggered using an action plan format where appropriate.  Total time for H and P, chart review, counseling, reviewing nebs/ 02 /pulse ox/ flutter valve  device(s) and generating customized AVS unique to this office visit / same day charting = 25 min acute eval

## 2024-01-18 NOTE — Progress Notes (Signed)
 Jeff D Vancleve Jr., male    DOB: Mar 03, 1966   MRN: [id number]   Brief patient profile:  58   yowm quit smoking 2023/MM  self referred to pulmonary clinic 05/10/2023 by  for aecopd       PFTs May 13, 2021 showed FEV1 at 26%, ratio 0.39, FVC 52%, no significant bronchodilator response, DLCO 60%    History of Present Illness  05/10/2023  Pulmonary/ 1st office eval/Jakorey Mcconathy maint on Trelgy  Chief Complaint  Patient presents with   Acute Visit    COPD   Dyspnea:  worse x  3-4 weeks  Cough: green with sensation of choking > no better p doxy/ assoc nasal congesition  Sleep: flat bed / wedge  SABA use: duoneb up to twice daily  02 use: 2.5 lpm  Rec Duoneb is 4 x daily and in am and pm add budesonide  0.25 mg (twice daily) to the duoneb  Omnicef  300 mg twice daily  Prednisone  10 mg take  4 each am x 2 days,   2 each am x 2 days,  1 each am x 2 days and stop   Stop trelegy / chocolate / mint/menthol   - ok to use LUDEN's  For cough/ congestion >  mucinex dm  up to maximum of  1200 mg every 12 hours and use the flutter valve as much as you can   Pantoprazole  (protonix ) 40 mg   Take  30-60 min before first meal of the day and Pepcid  (famotidine )  20 mg after supper until return to office  Make sure you check your oxygen  saturation  AT  your highest level of activity (not after you stop)   to be sure it stays over 90% Please schedule a follow up office visit in  2 weeks, sooner if needed  with all medications /inhalers/ solutions in hand    Allergy  screen 05/10/2023 >  Eos 0.5 /  IgE   52  - 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   05/24/2023  f/u ov/[place] office/Addysyn Fern re: copd/AB maint on duoneb/budesonide  0.25 > no better vs trelegy 100  Chief Complaint  Patient presents with   Follow-up    2 week follow up   Dyspnea:  no better  Cough: minimal improvement, mucus no longer green, no longer choking sensation  Sleeping: flat bed/ wedge pillow s    resp cc  SABA use: 2x daily  02:  2.5-3lpm Rec For cough/ congestion >   mucinex dm  up to maximum of  1200 mg every 12 hours and use the flutter valve as much as you can   Will try to get you started on dupixent  300 mg every 2 weeks  Prednisone  10 mg  x 2 1st thing in am until better then 1 daily x 5 days and stop  Please schedule a follow up office visit in 6 weeks, call sooner if needed with all medications /inhalers/ solutions in hand   07/05/2023  f/u ov/[place] office/Sangita Zani re: GOLD 4 copd/ 02 dep maint on trelegy did not  bring meds / just finished course of prednisone   Chief Complaint  Patient presents with   Cough  Dyspnea:  walking 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  Cough: worst in am / none noct  Sleeping: flat bed/ wedge pillow s    resp cc does fine supine  SABA use: duoneb 4 x daily confused  02: 3lpm 24/7  does not check levels Rec Duoneb is 4 x daily and in  am and pm add budesonide  0.25 mg (twice daily) to the duoneb  Stop trelegy / chocolate / mint/menthol   - ok to use LUDEN's  For cough/ congestion >  mucinex dm  up to maximum of  1200 mg every 12 hours and use the flutter valve as much as you can   Pantoprazole  (protonix ) 40 mg   Take  30-60 min before first meal of the day and Pepcid  (famotidine )  20 mg after supper until return to office - Make sure you check your oxygen  saturation  AT  your highest level of activity (not after you stop)   to be sure it stays over 90%.       We will try to get you certified for  Dupixent  300 mg daily - starting today.   Please schedule a follow up office visit in 6 weeks, call sooner if needed with all medications /inhalers/ solutions in hand     10/11/2023  f/u ov/[place] office/Gera Inboden re: GOLD 4 copd/ 02 dep  maint on dupixent  /duoneb 3-4 x per day   no recent prednisone   Chief Complaint  Patient presents with   Follow-up   COPD  Dyspnea:  walking dogs / [place] walking [place] problem off 02  Cough: still rattling all day / can't remember to use flutter  Sleeping:  flat bed with wedge pillow s   resp cc  SABA use: duoneb up to qid not really sure it makes any differetn 02: 3lpm hs and daytime prn Lung cancer screening: referred 10/11/2023  Rec Try change pepcid  to take after bfast and supper (twice daily and off the pantoprazole ) Stop trelegy  Duoneb is up to 4 x daily but the am dose and pm are combined with budesonide  0.25 mg  My office will be contacting you by phone for referral to lung cancer screening   [phone]) > not done as of 01/02/2024    Make sure you check your oxygen  saturation  AT  your highest level of activity (not after you stop)   to be sure it stays over 90%    Please schedule a follow up visit in 3 months but call sooner if needed  with all medications /inhalers/ solutions  - PFTs on return   PFT's  01/02/2024   FEV1 0.72 (21% % ) ratio 0.34  p 21 % improvement from saba p 0 prior to  study with DLCO  16 (63%)   and FV curve severely concave     01/02/2024  f/u ov/[place] office/Nathan Stallworth re: GOLD 4 copd/ 02 dep maint on duoneb / bud bid and dupixent  q 2 weeks    needs LDSCT but sick x 3-4 weeks   Chief Complaint  Patient presents with   COPD    Congestions, coughing with some green   Dyspnea:  doing ok on 3lpm  Cough: worse cough x 3-4 weeks still green p  zpak assoc with nasal congestion but no purulent nasal d/c Sleeping: bed is flat/ wedge pillow s  resp cc  SABA use:  02: 3lpm 24/7   Patient Instructions  Omnicef  300 mg twice daily  x 10 day   Depomedrol 120 mg IM  For cough/ congestion > mucinex or mucinex dm  up to maximum of  1200 mg every 12 hours and use the flutter valve as much as you can   Please schedule a follow up visit in 3 months but call sooner if needed    01/18/2024  ACUTE ov/[place] office/Admire Bunnell re: GOLD 4 copd/ 02 desp resp failure maint on duoneb/bud bid and twice daily duoneb if needed between the triple combo  Chief Complaint  Patient presents with   Acute Visit    Coughing, congestion,   green mucus  Dyspnea:  [presently extremely sedantary/ housebound and hospice has been called but no sob at rest  Cough: congested/ mostly mucoid/ poor cough mechanics s flutter valve Sleeping: flat bed/ wedge pillow   SABA use: nebs only  02: 3lpm 24/7      No obvious day to day or daytime variability or assoc excess/ purulent sputum or mucus plugs or hemoptysis or cp or chest tightness, subjective wheeze or overt   hb symptoms.    Also denies any obvious fluctuation of symptoms with weather or environmental changes or other aggravating or alleviating factors except as outlined above   No unusual exposure hx or h/o childhood pna/ asthma or knowledge of premature birth.  Current Allergies, Complete Past Medical History, Past Surgical History, Family History, and Social History were reviewed in [employer] record.  ROS  The following are not active complaints unless bolded Hoarseness, sore throat, dysphagia, dental problems, itching, sneezing,  nasal congestion or discharge of excess mucus or purulent secretions, ear ache,   fever, chills, sweats, unintended wt loss or wt gain, classically pleuritic or exertional cp,  orthopnea pnd or arm/hand swelling  or leg swelling, presyncope, palpitations, abdominal pain, anorexia, nausea, vomiting, diarrhea  or change in bowel habits or change in bladder habits, change in stools or change in urine, dysuria, hematuria,  rash, arthralgias, visual complaints, headache, numbness, weakness or ataxia or problems with walking or coordination,  change in mood or  memory.        Current Meds  Medication Sig   albuterol  (VENTOLIN  HFA) 108 (90 Base) MCG/ACT inhaler Inhale 2 puffs into the lungs every 4 (four) hours as needed.   ALPRAZolam (XANAX) 0.5 MG tablet Take 0.5 mg by mouth 3 (three) times daily.   atorvastatin (LIPITOR) 20 MG tablet Take 20 mg by mouth daily.   azelastine  (ASTELIN ) 0.1 % nasal spray Place 2 sprays into both  nostrils 2 (two) times daily. Use in each nostril as directed   benzonatate  (TESSALON ) 200 MG capsule Take 1 capsule (200 mg total) by mouth 2 (two) times daily as needed for cough.   budesonide  (PULMICORT ) 0.25 MG/2ML nebulizer solution SMARTSIG:1 Vial(s) Twice Daily   cefdinir  (OMNICEF ) 300 MG capsule Take 1 capsule (300 mg total) by mouth 2 (two) times daily.   donepezil (ARICEPT) 10 MG tablet Take 10 mg by mouth daily.   Dupilumab  (DUPIXENT ) 300 MG/2ML SOAJ Inject 300 mg into the skin every 14 (fourteen) days.   famotidine  (PEPCID ) 20 MG tablet One after supper   glimepiride (AMARYL) 2 MG tablet Take 2 mg by mouth daily.   guaiFENesin (MUCINEX) 600 MG 12 hr tablet Take 600 mg by mouth 2 (two) times daily.   ipratropium-albuterol  (DUONEB) 0.5-2.5 (3) MG/3ML SOLN Take 3 mLs by nebulization 4 (four) times daily.   loratadine (CLARITIN) 10 MG tablet Take 10 mg by mouth daily.   predniSONE  (DELTASONE ) 10 MG tablet 2 daily each am until better then 1 daily x 5 days and stop   QUEtiapine (SEROQUEL) 25 MG tablet Take 25 mg by mouth 2 (two) times daily.          Past Medical History:  Diagnosis Date   Asthma    COPD (chronic  obstructive pulmonary disease) (HCC)    Nocturnal hypoxemia       Objective:    Wts  01/18/2024        174  01/02/2024     183  10/11/2023       190  07/05/2023       194   05/24/23 205 lb (93 kg)  05/10/23 209 lb 9.6 oz (95.1 kg)  05/01/23 209 lb (94.8 kg)    Vital signs reviewed  01/18/2024  - Note at rest 02 sats  93% on 3lpm POC  General appearance:    stoic amb wm lets wife do the talking / spontaneously congested sounding cough  but mucus is relatively thin and minimally discolored   HEENT : Oropharynx  clear       NECK :  without  apparent JVD/ palpable Nodes/TM    LUNGS: no acc muscle use,  Mild barrel  contour chest wall with bilateral  Distant bs s audible wheeze and  without cough on insp or exp maneuvers  and mild  Hyperresonant  to  percussion  bilaterally     CV:  RRR  no s3 or murmur or increase in P2, and no edema   ABD:  soft and nontender   MS:  xt warm without deformities Or obvious joint restrictions  calf tenderness, cyanosis or clubbing     SKIN: warm and dry without lesions    NEURO:  alert, approp, nl sensorium with  no motor or cerebellar deficits apparent.                    . 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 -  05/24/2023  changed back to trelegy 100 / dupixent  300 mg q 2 weeks started 05/24/23 x 600 mg and given 300mg  07/05/2023 and referred to pharmacy  -PFTs May 13, 2021 showed FEV1 at 26%, ratio 39, FVC 52%, no significant bronchodilator response, DLCO 60%  - 05/10/2023 eval for refractory cough on trelegy so rec change to duoneb qid with budesonide  0.25 mg bid and max rx for gerd/ pred x 6 d, omnicef  300 mg bid x 10 d  - Allergy  screen 05/10/2023 >  Eos 0.5 /  IgE   52  - 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 -  05/24/2023  changed back to trelegy 100 / dupixent  300 mg q 2 weeks started 05/24/23  - PFT's  01/02/2024   FEV1 0.72 (21% % ) ratio 0.34  p 21 % improvement from saba p 0 prior to study with DLCO  16 (63%)   and FV curve severely concave     Main issue at this poin this point is not really resp distress or hyypoxemia but rather difficulty with retained secretions and now appears hosp appropriate to me   Will try pred 10 x 2 daily until appetite/ congestion improve then taper off   Could do MBS and VEST but now that starting hospice  program probably moot point as is continuing dupixent /  advised.   Chronic respiratory failure with hypoxia (HCC) HC03   05/10/2023  = 30   05/24/2023   Walked on 3lpm cont    x  3  lap(s) =  approx 450  ft  @ mod pace, stopped due to end of study  with lowest 02 sats 92  - 10/11/2023   Walked on RA  x  2  lap(s) =  approx 300  ft  @ slow/mod pace, stopped due to desats to 88%  with no sob > rec portable 02 / titrate with walking > 300 ft eg [employer] shopping.  Well compensated at present on 3lpm > ok to titrate up if necessary.   Each maintenance medication was reviewed in detail including emphasizing most importantly the difference between maintenance and prns and under what circumstances the prns are to be triggered using an action plan format where appropriate.  Total time for H and P, chart review, counseling, reviewing nebs/ 02 /pulse ox/ flutter valve  device(s) and generating customized AVS unique to this office visit / same day charting = 25 min acute eval          AVS  Patient Instructions  Prednisone  10 mg  x 2 each  AM  until improves then 1 daily  x 5 days and stop   No change in the other medications   Follow up now that he is on Hospice is as needed    Ozell America, MD 01/18/2024

## 2024-01-19 DIAGNOSIS — G4734 Idiopathic sleep related nonobstructive alveolar hypoventilation: Secondary | ICD-10-CM | POA: Diagnosis not present

## 2024-01-19 DIAGNOSIS — J449 Chronic obstructive pulmonary disease, unspecified: Secondary | ICD-10-CM | POA: Diagnosis not present

## 2024-01-19 DIAGNOSIS — R0609 Other forms of dyspnea: Secondary | ICD-10-CM | POA: Diagnosis not present

## 2024-01-19 DIAGNOSIS — F419 Anxiety disorder, unspecified: Secondary | ICD-10-CM | POA: Diagnosis not present

## 2024-01-19 DIAGNOSIS — Z87891 Personal history of nicotine dependence: Secondary | ICD-10-CM | POA: Diagnosis not present

## 2024-01-19 DIAGNOSIS — J45909 Unspecified asthma, uncomplicated: Secondary | ICD-10-CM | POA: Diagnosis not present

## 2024-01-19 DIAGNOSIS — J9611 Chronic respiratory failure with hypoxia: Secondary | ICD-10-CM | POA: Diagnosis not present

## 2024-01-19 DIAGNOSIS — E119 Type 2 diabetes mellitus without complications: Secondary | ICD-10-CM | POA: Diagnosis not present

## 2024-01-19 DIAGNOSIS — G3183 Dementia with Lewy bodies: Secondary | ICD-10-CM | POA: Diagnosis not present

## 2024-01-19 NOTE — Telephone Encounter (Signed)
 Seen in clinic 01/18/2024

## 2024-01-20 DIAGNOSIS — J9611 Chronic respiratory failure with hypoxia: Secondary | ICD-10-CM | POA: Diagnosis not present

## 2024-01-20 DIAGNOSIS — J45909 Unspecified asthma, uncomplicated: Secondary | ICD-10-CM | POA: Diagnosis not present

## 2024-01-20 DIAGNOSIS — G4734 Idiopathic sleep related nonobstructive alveolar hypoventilation: Secondary | ICD-10-CM | POA: Diagnosis not present

## 2024-01-20 DIAGNOSIS — R0609 Other forms of dyspnea: Secondary | ICD-10-CM | POA: Diagnosis not present

## 2024-01-20 DIAGNOSIS — E119 Type 2 diabetes mellitus without complications: Secondary | ICD-10-CM | POA: Diagnosis not present

## 2024-01-20 DIAGNOSIS — G3183 Dementia with Lewy bodies: Secondary | ICD-10-CM | POA: Diagnosis not present

## 2024-01-20 DIAGNOSIS — Z87891 Personal history of nicotine dependence: Secondary | ICD-10-CM | POA: Diagnosis not present

## 2024-01-20 DIAGNOSIS — J449 Chronic obstructive pulmonary disease, unspecified: Secondary | ICD-10-CM | POA: Diagnosis not present

## 2024-01-20 DIAGNOSIS — F419 Anxiety disorder, unspecified: Secondary | ICD-10-CM | POA: Diagnosis not present

## 2024-01-21 DIAGNOSIS — R0609 Other forms of dyspnea: Secondary | ICD-10-CM | POA: Diagnosis not present

## 2024-01-21 DIAGNOSIS — G3183 Dementia with Lewy bodies: Secondary | ICD-10-CM | POA: Diagnosis not present

## 2024-01-21 DIAGNOSIS — J45909 Unspecified asthma, uncomplicated: Secondary | ICD-10-CM | POA: Diagnosis not present

## 2024-01-21 DIAGNOSIS — J449 Chronic obstructive pulmonary disease, unspecified: Secondary | ICD-10-CM | POA: Diagnosis not present

## 2024-01-21 DIAGNOSIS — Z87891 Personal history of nicotine dependence: Secondary | ICD-10-CM | POA: Diagnosis not present

## 2024-01-21 DIAGNOSIS — J9611 Chronic respiratory failure with hypoxia: Secondary | ICD-10-CM | POA: Diagnosis not present

## 2024-01-21 DIAGNOSIS — F419 Anxiety disorder, unspecified: Secondary | ICD-10-CM | POA: Diagnosis not present

## 2024-01-21 DIAGNOSIS — E119 Type 2 diabetes mellitus without complications: Secondary | ICD-10-CM | POA: Diagnosis not present

## 2024-01-21 DIAGNOSIS — G4734 Idiopathic sleep related nonobstructive alveolar hypoventilation: Secondary | ICD-10-CM | POA: Diagnosis not present

## 2024-01-22 DIAGNOSIS — J45909 Unspecified asthma, uncomplicated: Secondary | ICD-10-CM | POA: Diagnosis not present

## 2024-01-22 DIAGNOSIS — J449 Chronic obstructive pulmonary disease, unspecified: Secondary | ICD-10-CM | POA: Diagnosis not present

## 2024-01-22 DIAGNOSIS — G4734 Idiopathic sleep related nonobstructive alveolar hypoventilation: Secondary | ICD-10-CM | POA: Diagnosis not present

## 2024-01-22 DIAGNOSIS — J9611 Chronic respiratory failure with hypoxia: Secondary | ICD-10-CM | POA: Diagnosis not present

## 2024-01-22 DIAGNOSIS — G3183 Dementia with Lewy bodies: Secondary | ICD-10-CM | POA: Diagnosis not present

## 2024-01-22 DIAGNOSIS — F419 Anxiety disorder, unspecified: Secondary | ICD-10-CM | POA: Diagnosis not present

## 2024-01-22 DIAGNOSIS — E119 Type 2 diabetes mellitus without complications: Secondary | ICD-10-CM | POA: Diagnosis not present

## 2024-01-22 DIAGNOSIS — R0609 Other forms of dyspnea: Secondary | ICD-10-CM | POA: Diagnosis not present

## 2024-01-22 DIAGNOSIS — Z87891 Personal history of nicotine dependence: Secondary | ICD-10-CM | POA: Diagnosis not present

## 2024-01-23 DIAGNOSIS — R0609 Other forms of dyspnea: Secondary | ICD-10-CM | POA: Diagnosis not present

## 2024-01-23 DIAGNOSIS — G3183 Dementia with Lewy bodies: Secondary | ICD-10-CM | POA: Diagnosis not present

## 2024-01-23 DIAGNOSIS — G4734 Idiopathic sleep related nonobstructive alveolar hypoventilation: Secondary | ICD-10-CM | POA: Diagnosis not present

## 2024-01-23 DIAGNOSIS — J45909 Unspecified asthma, uncomplicated: Secondary | ICD-10-CM | POA: Diagnosis not present

## 2024-01-23 DIAGNOSIS — E119 Type 2 diabetes mellitus without complications: Secondary | ICD-10-CM | POA: Diagnosis not present

## 2024-01-23 DIAGNOSIS — J449 Chronic obstructive pulmonary disease, unspecified: Secondary | ICD-10-CM | POA: Diagnosis not present

## 2024-01-23 DIAGNOSIS — Z87891 Personal history of nicotine dependence: Secondary | ICD-10-CM | POA: Diagnosis not present

## 2024-01-23 DIAGNOSIS — J9611 Chronic respiratory failure with hypoxia: Secondary | ICD-10-CM | POA: Diagnosis not present

## 2024-01-23 DIAGNOSIS — F419 Anxiety disorder, unspecified: Secondary | ICD-10-CM | POA: Diagnosis not present

## 2024-01-24 DIAGNOSIS — G3183 Dementia with Lewy bodies: Secondary | ICD-10-CM | POA: Diagnosis not present

## 2024-01-24 DIAGNOSIS — J9611 Chronic respiratory failure with hypoxia: Secondary | ICD-10-CM | POA: Diagnosis not present

## 2024-01-24 DIAGNOSIS — R0609 Other forms of dyspnea: Secondary | ICD-10-CM | POA: Diagnosis not present

## 2024-01-24 DIAGNOSIS — G4734 Idiopathic sleep related nonobstructive alveolar hypoventilation: Secondary | ICD-10-CM | POA: Diagnosis not present

## 2024-01-24 DIAGNOSIS — Z87891 Personal history of nicotine dependence: Secondary | ICD-10-CM | POA: Diagnosis not present

## 2024-01-24 DIAGNOSIS — F419 Anxiety disorder, unspecified: Secondary | ICD-10-CM | POA: Diagnosis not present

## 2024-01-24 DIAGNOSIS — J449 Chronic obstructive pulmonary disease, unspecified: Secondary | ICD-10-CM | POA: Diagnosis not present

## 2024-01-24 DIAGNOSIS — E119 Type 2 diabetes mellitus without complications: Secondary | ICD-10-CM | POA: Diagnosis not present

## 2024-01-24 DIAGNOSIS — J45909 Unspecified asthma, uncomplicated: Secondary | ICD-10-CM | POA: Diagnosis not present

## 2024-01-25 DIAGNOSIS — E119 Type 2 diabetes mellitus without complications: Secondary | ICD-10-CM | POA: Diagnosis not present

## 2024-01-25 DIAGNOSIS — Z87891 Personal history of nicotine dependence: Secondary | ICD-10-CM | POA: Diagnosis not present

## 2024-01-25 DIAGNOSIS — J45909 Unspecified asthma, uncomplicated: Secondary | ICD-10-CM | POA: Diagnosis not present

## 2024-01-25 DIAGNOSIS — F419 Anxiety disorder, unspecified: Secondary | ICD-10-CM | POA: Diagnosis not present

## 2024-01-25 DIAGNOSIS — J9611 Chronic respiratory failure with hypoxia: Secondary | ICD-10-CM | POA: Diagnosis not present

## 2024-01-25 DIAGNOSIS — G3183 Dementia with Lewy bodies: Secondary | ICD-10-CM | POA: Diagnosis not present

## 2024-01-25 DIAGNOSIS — R0609 Other forms of dyspnea: Secondary | ICD-10-CM | POA: Diagnosis not present

## 2024-01-25 DIAGNOSIS — G4734 Idiopathic sleep related nonobstructive alveolar hypoventilation: Secondary | ICD-10-CM | POA: Diagnosis not present

## 2024-01-25 DIAGNOSIS — J449 Chronic obstructive pulmonary disease, unspecified: Secondary | ICD-10-CM | POA: Diagnosis not present

## 2024-01-26 DIAGNOSIS — E119 Type 2 diabetes mellitus without complications: Secondary | ICD-10-CM | POA: Diagnosis not present

## 2024-01-26 DIAGNOSIS — Z87891 Personal history of nicotine dependence: Secondary | ICD-10-CM | POA: Diagnosis not present

## 2024-01-26 DIAGNOSIS — J45909 Unspecified asthma, uncomplicated: Secondary | ICD-10-CM | POA: Diagnosis not present

## 2024-01-26 DIAGNOSIS — J9611 Chronic respiratory failure with hypoxia: Secondary | ICD-10-CM | POA: Diagnosis not present

## 2024-01-26 DIAGNOSIS — R0609 Other forms of dyspnea: Secondary | ICD-10-CM | POA: Diagnosis not present

## 2024-01-26 DIAGNOSIS — G3183 Dementia with Lewy bodies: Secondary | ICD-10-CM | POA: Diagnosis not present

## 2024-01-26 DIAGNOSIS — F419 Anxiety disorder, unspecified: Secondary | ICD-10-CM | POA: Diagnosis not present

## 2024-01-26 DIAGNOSIS — J449 Chronic obstructive pulmonary disease, unspecified: Secondary | ICD-10-CM | POA: Diagnosis not present

## 2024-01-26 DIAGNOSIS — G4734 Idiopathic sleep related nonobstructive alveolar hypoventilation: Secondary | ICD-10-CM | POA: Diagnosis not present

## 2024-01-27 DIAGNOSIS — F419 Anxiety disorder, unspecified: Secondary | ICD-10-CM | POA: Diagnosis not present

## 2024-01-27 DIAGNOSIS — J9611 Chronic respiratory failure with hypoxia: Secondary | ICD-10-CM | POA: Diagnosis not present

## 2024-01-27 DIAGNOSIS — G4734 Idiopathic sleep related nonobstructive alveolar hypoventilation: Secondary | ICD-10-CM | POA: Diagnosis not present

## 2024-01-27 DIAGNOSIS — G3183 Dementia with Lewy bodies: Secondary | ICD-10-CM | POA: Diagnosis not present

## 2024-01-27 DIAGNOSIS — J45909 Unspecified asthma, uncomplicated: Secondary | ICD-10-CM | POA: Diagnosis not present

## 2024-01-27 DIAGNOSIS — R0609 Other forms of dyspnea: Secondary | ICD-10-CM | POA: Diagnosis not present

## 2024-01-27 DIAGNOSIS — J449 Chronic obstructive pulmonary disease, unspecified: Secondary | ICD-10-CM | POA: Diagnosis not present

## 2024-01-27 DIAGNOSIS — Z87891 Personal history of nicotine dependence: Secondary | ICD-10-CM | POA: Diagnosis not present

## 2024-01-27 DIAGNOSIS — E119 Type 2 diabetes mellitus without complications: Secondary | ICD-10-CM | POA: Diagnosis not present

## 2024-01-28 DIAGNOSIS — R0609 Other forms of dyspnea: Secondary | ICD-10-CM | POA: Diagnosis not present

## 2024-01-28 DIAGNOSIS — G3183 Dementia with Lewy bodies: Secondary | ICD-10-CM | POA: Diagnosis not present

## 2024-01-28 DIAGNOSIS — J9611 Chronic respiratory failure with hypoxia: Secondary | ICD-10-CM | POA: Diagnosis not present

## 2024-01-28 DIAGNOSIS — F419 Anxiety disorder, unspecified: Secondary | ICD-10-CM | POA: Diagnosis not present

## 2024-01-28 DIAGNOSIS — G4734 Idiopathic sleep related nonobstructive alveolar hypoventilation: Secondary | ICD-10-CM | POA: Diagnosis not present

## 2024-01-28 DIAGNOSIS — Z87891 Personal history of nicotine dependence: Secondary | ICD-10-CM | POA: Diagnosis not present

## 2024-01-28 DIAGNOSIS — J449 Chronic obstructive pulmonary disease, unspecified: Secondary | ICD-10-CM | POA: Diagnosis not present

## 2024-01-28 DIAGNOSIS — E119 Type 2 diabetes mellitus without complications: Secondary | ICD-10-CM | POA: Diagnosis not present

## 2024-01-28 DIAGNOSIS — J45909 Unspecified asthma, uncomplicated: Secondary | ICD-10-CM | POA: Diagnosis not present

## 2024-01-29 DIAGNOSIS — R0609 Other forms of dyspnea: Secondary | ICD-10-CM | POA: Diagnosis not present

## 2024-01-29 DIAGNOSIS — G3183 Dementia with Lewy bodies: Secondary | ICD-10-CM | POA: Diagnosis not present

## 2024-01-29 DIAGNOSIS — F419 Anxiety disorder, unspecified: Secondary | ICD-10-CM | POA: Diagnosis not present

## 2024-01-29 DIAGNOSIS — Z87891 Personal history of nicotine dependence: Secondary | ICD-10-CM | POA: Diagnosis not present

## 2024-01-29 DIAGNOSIS — E119 Type 2 diabetes mellitus without complications: Secondary | ICD-10-CM | POA: Diagnosis not present

## 2024-01-29 DIAGNOSIS — J45909 Unspecified asthma, uncomplicated: Secondary | ICD-10-CM | POA: Diagnosis not present

## 2024-01-29 DIAGNOSIS — G4734 Idiopathic sleep related nonobstructive alveolar hypoventilation: Secondary | ICD-10-CM | POA: Diagnosis not present

## 2024-01-29 DIAGNOSIS — J449 Chronic obstructive pulmonary disease, unspecified: Secondary | ICD-10-CM | POA: Diagnosis not present

## 2024-01-29 DIAGNOSIS — J9611 Chronic respiratory failure with hypoxia: Secondary | ICD-10-CM | POA: Diagnosis not present

## 2024-01-30 DIAGNOSIS — Z87891 Personal history of nicotine dependence: Secondary | ICD-10-CM | POA: Diagnosis not present

## 2024-01-30 DIAGNOSIS — G4734 Idiopathic sleep related nonobstructive alveolar hypoventilation: Secondary | ICD-10-CM | POA: Diagnosis not present

## 2024-01-30 DIAGNOSIS — J9611 Chronic respiratory failure with hypoxia: Secondary | ICD-10-CM | POA: Diagnosis not present

## 2024-01-30 DIAGNOSIS — R0609 Other forms of dyspnea: Secondary | ICD-10-CM | POA: Diagnosis not present

## 2024-01-30 DIAGNOSIS — E119 Type 2 diabetes mellitus without complications: Secondary | ICD-10-CM | POA: Diagnosis not present

## 2024-01-30 DIAGNOSIS — J449 Chronic obstructive pulmonary disease, unspecified: Secondary | ICD-10-CM | POA: Diagnosis not present

## 2024-01-30 DIAGNOSIS — G3183 Dementia with Lewy bodies: Secondary | ICD-10-CM | POA: Diagnosis not present

## 2024-01-30 DIAGNOSIS — F419 Anxiety disorder, unspecified: Secondary | ICD-10-CM | POA: Diagnosis not present

## 2024-01-30 DIAGNOSIS — J45909 Unspecified asthma, uncomplicated: Secondary | ICD-10-CM | POA: Diagnosis not present

## 2024-01-31 DIAGNOSIS — G4734 Idiopathic sleep related nonobstructive alveolar hypoventilation: Secondary | ICD-10-CM | POA: Diagnosis not present

## 2024-01-31 DIAGNOSIS — E119 Type 2 diabetes mellitus without complications: Secondary | ICD-10-CM | POA: Diagnosis not present

## 2024-01-31 DIAGNOSIS — G3183 Dementia with Lewy bodies: Secondary | ICD-10-CM | POA: Diagnosis not present

## 2024-01-31 DIAGNOSIS — J9611 Chronic respiratory failure with hypoxia: Secondary | ICD-10-CM | POA: Diagnosis not present

## 2024-01-31 DIAGNOSIS — J449 Chronic obstructive pulmonary disease, unspecified: Secondary | ICD-10-CM | POA: Diagnosis not present

## 2024-01-31 DIAGNOSIS — J45909 Unspecified asthma, uncomplicated: Secondary | ICD-10-CM | POA: Diagnosis not present

## 2024-01-31 DIAGNOSIS — F419 Anxiety disorder, unspecified: Secondary | ICD-10-CM | POA: Diagnosis not present

## 2024-01-31 DIAGNOSIS — Z87891 Personal history of nicotine dependence: Secondary | ICD-10-CM | POA: Diagnosis not present

## 2024-01-31 DIAGNOSIS — R0609 Other forms of dyspnea: Secondary | ICD-10-CM | POA: Diagnosis not present

## 2024-02-01 DIAGNOSIS — Z87891 Personal history of nicotine dependence: Secondary | ICD-10-CM | POA: Diagnosis not present

## 2024-02-01 DIAGNOSIS — J449 Chronic obstructive pulmonary disease, unspecified: Secondary | ICD-10-CM | POA: Diagnosis not present

## 2024-02-01 DIAGNOSIS — F419 Anxiety disorder, unspecified: Secondary | ICD-10-CM | POA: Diagnosis not present

## 2024-02-01 DIAGNOSIS — J45909 Unspecified asthma, uncomplicated: Secondary | ICD-10-CM | POA: Diagnosis not present

## 2024-02-01 DIAGNOSIS — J9611 Chronic respiratory failure with hypoxia: Secondary | ICD-10-CM | POA: Diagnosis not present

## 2024-02-01 DIAGNOSIS — G4734 Idiopathic sleep related nonobstructive alveolar hypoventilation: Secondary | ICD-10-CM | POA: Diagnosis not present

## 2024-02-01 DIAGNOSIS — R0609 Other forms of dyspnea: Secondary | ICD-10-CM | POA: Diagnosis not present

## 2024-02-01 DIAGNOSIS — G3183 Dementia with Lewy bodies: Secondary | ICD-10-CM | POA: Diagnosis not present

## 2024-02-01 DIAGNOSIS — E119 Type 2 diabetes mellitus without complications: Secondary | ICD-10-CM | POA: Diagnosis not present

## 2024-02-02 DIAGNOSIS — J9611 Chronic respiratory failure with hypoxia: Secondary | ICD-10-CM | POA: Diagnosis not present

## 2024-02-02 DIAGNOSIS — G4734 Idiopathic sleep related nonobstructive alveolar hypoventilation: Secondary | ICD-10-CM | POA: Diagnosis not present

## 2024-02-02 DIAGNOSIS — J45909 Unspecified asthma, uncomplicated: Secondary | ICD-10-CM | POA: Diagnosis not present

## 2024-02-02 DIAGNOSIS — E119 Type 2 diabetes mellitus without complications: Secondary | ICD-10-CM | POA: Diagnosis not present

## 2024-02-02 DIAGNOSIS — R0609 Other forms of dyspnea: Secondary | ICD-10-CM | POA: Diagnosis not present

## 2024-02-02 DIAGNOSIS — G3183 Dementia with Lewy bodies: Secondary | ICD-10-CM | POA: Diagnosis not present

## 2024-02-02 DIAGNOSIS — F419 Anxiety disorder, unspecified: Secondary | ICD-10-CM | POA: Diagnosis not present

## 2024-02-02 DIAGNOSIS — J449 Chronic obstructive pulmonary disease, unspecified: Secondary | ICD-10-CM | POA: Diagnosis not present

## 2024-02-02 DIAGNOSIS — Z87891 Personal history of nicotine dependence: Secondary | ICD-10-CM | POA: Diagnosis not present

## 2024-02-03 DIAGNOSIS — G3183 Dementia with Lewy bodies: Secondary | ICD-10-CM | POA: Diagnosis not present

## 2024-02-03 DIAGNOSIS — G4734 Idiopathic sleep related nonobstructive alveolar hypoventilation: Secondary | ICD-10-CM | POA: Diagnosis not present

## 2024-02-03 DIAGNOSIS — Z87891 Personal history of nicotine dependence: Secondary | ICD-10-CM | POA: Diagnosis not present

## 2024-02-03 DIAGNOSIS — F419 Anxiety disorder, unspecified: Secondary | ICD-10-CM | POA: Diagnosis not present

## 2024-02-03 DIAGNOSIS — E119 Type 2 diabetes mellitus without complications: Secondary | ICD-10-CM | POA: Diagnosis not present

## 2024-02-03 DIAGNOSIS — J449 Chronic obstructive pulmonary disease, unspecified: Secondary | ICD-10-CM | POA: Diagnosis not present

## 2024-02-03 DIAGNOSIS — J9611 Chronic respiratory failure with hypoxia: Secondary | ICD-10-CM | POA: Diagnosis not present

## 2024-02-03 DIAGNOSIS — J45909 Unspecified asthma, uncomplicated: Secondary | ICD-10-CM | POA: Diagnosis not present

## 2024-02-03 DIAGNOSIS — R0609 Other forms of dyspnea: Secondary | ICD-10-CM | POA: Diagnosis not present

## 2024-02-04 DIAGNOSIS — E119 Type 2 diabetes mellitus without complications: Secondary | ICD-10-CM | POA: Diagnosis not present

## 2024-02-04 DIAGNOSIS — G3183 Dementia with Lewy bodies: Secondary | ICD-10-CM | POA: Diagnosis not present

## 2024-02-04 DIAGNOSIS — J45909 Unspecified asthma, uncomplicated: Secondary | ICD-10-CM | POA: Diagnosis not present

## 2024-02-04 DIAGNOSIS — J449 Chronic obstructive pulmonary disease, unspecified: Secondary | ICD-10-CM | POA: Diagnosis not present

## 2024-02-04 DIAGNOSIS — G4734 Idiopathic sleep related nonobstructive alveolar hypoventilation: Secondary | ICD-10-CM | POA: Diagnosis not present

## 2024-02-04 DIAGNOSIS — Z87891 Personal history of nicotine dependence: Secondary | ICD-10-CM | POA: Diagnosis not present

## 2024-02-04 DIAGNOSIS — F419 Anxiety disorder, unspecified: Secondary | ICD-10-CM | POA: Diagnosis not present

## 2024-02-04 DIAGNOSIS — R0609 Other forms of dyspnea: Secondary | ICD-10-CM | POA: Diagnosis not present

## 2024-02-04 DIAGNOSIS — J9611 Chronic respiratory failure with hypoxia: Secondary | ICD-10-CM | POA: Diagnosis not present

## 2024-02-05 DIAGNOSIS — J45909 Unspecified asthma, uncomplicated: Secondary | ICD-10-CM | POA: Diagnosis not present

## 2024-02-05 DIAGNOSIS — E119 Type 2 diabetes mellitus without complications: Secondary | ICD-10-CM | POA: Diagnosis not present

## 2024-02-05 DIAGNOSIS — G4734 Idiopathic sleep related nonobstructive alveolar hypoventilation: Secondary | ICD-10-CM | POA: Diagnosis not present

## 2024-02-05 DIAGNOSIS — G3183 Dementia with Lewy bodies: Secondary | ICD-10-CM | POA: Diagnosis not present

## 2024-02-05 DIAGNOSIS — J9611 Chronic respiratory failure with hypoxia: Secondary | ICD-10-CM | POA: Diagnosis not present

## 2024-02-05 DIAGNOSIS — R0609 Other forms of dyspnea: Secondary | ICD-10-CM | POA: Diagnosis not present

## 2024-02-05 DIAGNOSIS — J449 Chronic obstructive pulmonary disease, unspecified: Secondary | ICD-10-CM | POA: Diagnosis not present

## 2024-02-05 DIAGNOSIS — F419 Anxiety disorder, unspecified: Secondary | ICD-10-CM | POA: Diagnosis not present

## 2024-02-05 DIAGNOSIS — Z87891 Personal history of nicotine dependence: Secondary | ICD-10-CM | POA: Diagnosis not present

## 2024-02-06 DIAGNOSIS — R0609 Other forms of dyspnea: Secondary | ICD-10-CM | POA: Diagnosis not present

## 2024-02-06 DIAGNOSIS — E119 Type 2 diabetes mellitus without complications: Secondary | ICD-10-CM | POA: Diagnosis not present

## 2024-02-06 DIAGNOSIS — G4734 Idiopathic sleep related nonobstructive alveolar hypoventilation: Secondary | ICD-10-CM | POA: Diagnosis not present

## 2024-02-06 DIAGNOSIS — J45909 Unspecified asthma, uncomplicated: Secondary | ICD-10-CM | POA: Diagnosis not present

## 2024-02-06 DIAGNOSIS — J449 Chronic obstructive pulmonary disease, unspecified: Secondary | ICD-10-CM | POA: Diagnosis not present

## 2024-02-06 DIAGNOSIS — G3183 Dementia with Lewy bodies: Secondary | ICD-10-CM | POA: Diagnosis not present

## 2024-02-06 DIAGNOSIS — J9611 Chronic respiratory failure with hypoxia: Secondary | ICD-10-CM | POA: Diagnosis not present

## 2024-02-06 DIAGNOSIS — F419 Anxiety disorder, unspecified: Secondary | ICD-10-CM | POA: Diagnosis not present

## 2024-02-06 DIAGNOSIS — Z87891 Personal history of nicotine dependence: Secondary | ICD-10-CM | POA: Diagnosis not present

## 2024-02-07 DIAGNOSIS — G3183 Dementia with Lewy bodies: Secondary | ICD-10-CM | POA: Diagnosis not present

## 2024-02-07 DIAGNOSIS — G4734 Idiopathic sleep related nonobstructive alveolar hypoventilation: Secondary | ICD-10-CM | POA: Diagnosis not present

## 2024-02-07 DIAGNOSIS — E119 Type 2 diabetes mellitus without complications: Secondary | ICD-10-CM | POA: Diagnosis not present

## 2024-02-07 DIAGNOSIS — Z87891 Personal history of nicotine dependence: Secondary | ICD-10-CM | POA: Diagnosis not present

## 2024-02-07 DIAGNOSIS — F419 Anxiety disorder, unspecified: Secondary | ICD-10-CM | POA: Diagnosis not present

## 2024-02-07 DIAGNOSIS — J9611 Chronic respiratory failure with hypoxia: Secondary | ICD-10-CM | POA: Diagnosis not present

## 2024-02-07 DIAGNOSIS — R0609 Other forms of dyspnea: Secondary | ICD-10-CM | POA: Diagnosis not present

## 2024-02-07 DIAGNOSIS — J45909 Unspecified asthma, uncomplicated: Secondary | ICD-10-CM | POA: Diagnosis not present

## 2024-02-07 DIAGNOSIS — J449 Chronic obstructive pulmonary disease, unspecified: Secondary | ICD-10-CM | POA: Diagnosis not present

## 2024-02-08 DIAGNOSIS — J45909 Unspecified asthma, uncomplicated: Secondary | ICD-10-CM | POA: Diagnosis not present

## 2024-02-08 DIAGNOSIS — F419 Anxiety disorder, unspecified: Secondary | ICD-10-CM | POA: Diagnosis not present

## 2024-02-08 DIAGNOSIS — G3183 Dementia with Lewy bodies: Secondary | ICD-10-CM | POA: Diagnosis not present

## 2024-02-08 DIAGNOSIS — Z87891 Personal history of nicotine dependence: Secondary | ICD-10-CM | POA: Diagnosis not present

## 2024-02-08 DIAGNOSIS — E119 Type 2 diabetes mellitus without complications: Secondary | ICD-10-CM | POA: Diagnosis not present

## 2024-02-08 DIAGNOSIS — R0609 Other forms of dyspnea: Secondary | ICD-10-CM | POA: Diagnosis not present

## 2024-02-08 DIAGNOSIS — J9611 Chronic respiratory failure with hypoxia: Secondary | ICD-10-CM | POA: Diagnosis not present

## 2024-02-08 DIAGNOSIS — G4734 Idiopathic sleep related nonobstructive alveolar hypoventilation: Secondary | ICD-10-CM | POA: Diagnosis not present

## 2024-02-08 DIAGNOSIS — J449 Chronic obstructive pulmonary disease, unspecified: Secondary | ICD-10-CM | POA: Diagnosis not present

## 2024-02-09 DIAGNOSIS — E119 Type 2 diabetes mellitus without complications: Secondary | ICD-10-CM | POA: Diagnosis not present

## 2024-02-09 DIAGNOSIS — G3183 Dementia with Lewy bodies: Secondary | ICD-10-CM | POA: Diagnosis not present

## 2024-02-09 DIAGNOSIS — F419 Anxiety disorder, unspecified: Secondary | ICD-10-CM | POA: Diagnosis not present

## 2024-02-09 DIAGNOSIS — R0609 Other forms of dyspnea: Secondary | ICD-10-CM | POA: Diagnosis not present

## 2024-02-09 DIAGNOSIS — J449 Chronic obstructive pulmonary disease, unspecified: Secondary | ICD-10-CM | POA: Diagnosis not present

## 2024-02-09 DIAGNOSIS — Z87891 Personal history of nicotine dependence: Secondary | ICD-10-CM | POA: Diagnosis not present

## 2024-02-09 DIAGNOSIS — J45909 Unspecified asthma, uncomplicated: Secondary | ICD-10-CM | POA: Diagnosis not present

## 2024-02-09 DIAGNOSIS — J9611 Chronic respiratory failure with hypoxia: Secondary | ICD-10-CM | POA: Diagnosis not present

## 2024-02-09 DIAGNOSIS — G4734 Idiopathic sleep related nonobstructive alveolar hypoventilation: Secondary | ICD-10-CM | POA: Diagnosis not present

## 2024-02-10 DIAGNOSIS — F419 Anxiety disorder, unspecified: Secondary | ICD-10-CM | POA: Diagnosis not present

## 2024-02-10 DIAGNOSIS — Z87891 Personal history of nicotine dependence: Secondary | ICD-10-CM | POA: Diagnosis not present

## 2024-02-10 DIAGNOSIS — G3183 Dementia with Lewy bodies: Secondary | ICD-10-CM | POA: Diagnosis not present

## 2024-02-10 DIAGNOSIS — E119 Type 2 diabetes mellitus without complications: Secondary | ICD-10-CM | POA: Diagnosis not present

## 2024-02-10 DIAGNOSIS — J9611 Chronic respiratory failure with hypoxia: Secondary | ICD-10-CM | POA: Diagnosis not present

## 2024-02-10 DIAGNOSIS — G4734 Idiopathic sleep related nonobstructive alveolar hypoventilation: Secondary | ICD-10-CM | POA: Diagnosis not present

## 2024-02-10 DIAGNOSIS — R0609 Other forms of dyspnea: Secondary | ICD-10-CM | POA: Diagnosis not present

## 2024-02-10 DIAGNOSIS — J45909 Unspecified asthma, uncomplicated: Secondary | ICD-10-CM | POA: Diagnosis not present

## 2024-02-10 DIAGNOSIS — J449 Chronic obstructive pulmonary disease, unspecified: Secondary | ICD-10-CM | POA: Diagnosis not present

## 2024-02-11 DIAGNOSIS — J9611 Chronic respiratory failure with hypoxia: Secondary | ICD-10-CM | POA: Diagnosis not present

## 2024-02-11 DIAGNOSIS — J449 Chronic obstructive pulmonary disease, unspecified: Secondary | ICD-10-CM | POA: Diagnosis not present

## 2024-02-11 DIAGNOSIS — G3183 Dementia with Lewy bodies: Secondary | ICD-10-CM | POA: Diagnosis not present

## 2024-02-11 DIAGNOSIS — J45909 Unspecified asthma, uncomplicated: Secondary | ICD-10-CM | POA: Diagnosis not present

## 2024-02-11 DIAGNOSIS — G4734 Idiopathic sleep related nonobstructive alveolar hypoventilation: Secondary | ICD-10-CM | POA: Diagnosis not present

## 2024-02-11 DIAGNOSIS — Z87891 Personal history of nicotine dependence: Secondary | ICD-10-CM | POA: Diagnosis not present

## 2024-02-11 DIAGNOSIS — E119 Type 2 diabetes mellitus without complications: Secondary | ICD-10-CM | POA: Diagnosis not present

## 2024-02-11 DIAGNOSIS — R0609 Other forms of dyspnea: Secondary | ICD-10-CM | POA: Diagnosis not present

## 2024-02-11 DIAGNOSIS — F419 Anxiety disorder, unspecified: Secondary | ICD-10-CM | POA: Diagnosis not present

## 2024-04-01 ENCOUNTER — Ambulatory Visit: Admitting: Internal Medicine
